# Patient Record
Sex: Male | Born: 1970 | Race: White | Hispanic: Yes | Marital: Married | Smoking: Current every day smoker
Health system: Southern US, Community
[De-identification: ages and names within clinical notes are randomized; demographics above are authoritative.]

## PROBLEM LIST (undated history)

## (undated) DIAGNOSIS — I10 Essential (primary) hypertension: Secondary | ICD-10-CM

---

## 2017-03-28 ENCOUNTER — Encounter (HOSPITAL_COMMUNITY): Payer: Self-pay | Admitting: Emergency Medicine

## 2017-03-28 DIAGNOSIS — I10 Essential (primary) hypertension: Secondary | ICD-10-CM | POA: Insufficient documentation

## 2017-03-28 DIAGNOSIS — K611 Rectal abscess: Secondary | ICD-10-CM | POA: Insufficient documentation

## 2017-03-28 DIAGNOSIS — F17210 Nicotine dependence, cigarettes, uncomplicated: Secondary | ICD-10-CM | POA: Insufficient documentation

## 2017-03-28 NOTE — ED Triage Notes (Signed)
Pt c/o 10/10 rectal pain for a week, unable to have a BM and very painful urination. Pt states he is unable to sit down because the pain denies any fever, chills, nausea or vomiting.

## 2017-03-29 ENCOUNTER — Emergency Department (HOSPITAL_COMMUNITY): Payer: Self-pay

## 2017-03-29 ENCOUNTER — Emergency Department (HOSPITAL_COMMUNITY)
Admission: EM | Admit: 2017-03-29 | Discharge: 2017-03-29 | Disposition: A | Discharge status: Home / Self Care | Payer: Self-pay | Attending: Emergency Medicine | Admitting: Emergency Medicine

## 2017-03-29 DIAGNOSIS — K611 Rectal abscess: Secondary | ICD-10-CM

## 2017-03-29 DIAGNOSIS — K6289 Other specified diseases of anus and rectum: Secondary | ICD-10-CM

## 2017-03-29 HISTORY — DX: Essential (primary) hypertension: I10

## 2017-03-29 LAB — CBC WITH DIFFERENTIAL/PLATELET
Basophils Absolute: 0 10*3/uL (ref 0.0–0.1)
Basophils Relative: 0 %
Eosinophils Absolute: 0.2 10*3/uL (ref 0.0–0.7)
Eosinophils Relative: 1 %
HCT: 40.1 % (ref 39.0–52.0)
Hemoglobin: 13.8 g/dL (ref 13.0–17.0)
Lymphocytes Relative: 12 %
Lymphs Abs: 2 10*3/uL (ref 0.7–4.0)
MCH: 32.5 pg (ref 26.0–34.0)
MCHC: 34.4 g/dL (ref 30.0–36.0)
MCV: 94.6 fL (ref 78.0–100.0)
Monocytes Absolute: 1.5 10*3/uL — ABNORMAL HIGH (ref 0.1–1.0)
Monocytes Relative: 9 %
Neutro Abs: 12.8 10*3/uL — ABNORMAL HIGH (ref 1.7–7.7)
Neutrophils Relative %: 78 %
Platelets: 238 10*3/uL (ref 150–400)
RBC: 4.24 MIL/uL (ref 4.22–5.81)
RDW: 12.4 % (ref 11.5–15.5)
WBC: 16.4 10*3/uL — ABNORMAL HIGH (ref 4.0–10.5)

## 2017-03-29 LAB — BASIC METABOLIC PANEL
Anion gap: 8 (ref 5–15)
BUN: 13 mg/dL (ref 6–20)
CO2: 25 mmol/L (ref 22–32)
Calcium: 8.5 mg/dL — ABNORMAL LOW (ref 8.9–10.3)
Chloride: 103 mmol/L (ref 101–111)
Creatinine, Ser: 0.83 mg/dL (ref 0.61–1.24)
GFR calc Af Amer: >60 mL/min (ref >60)
GFR calc non Af Amer: >60 mL/min (ref >60)
Glucose, Bld: 113 mg/dL — ABNORMAL HIGH (ref 65–99)
Potassium: 3.6 mmol/L (ref 3.5–5.1)
Sodium: 136 mmol/L (ref 135–145)

## 2017-03-29 MED ORDER — IOPAMIDOL (ISOVUE-300) INJECTION 61%
INTRAVENOUS | Status: AC
  Administered 2017-03-29: 100 mL
  Filled 2017-03-29: qty 100

## 2017-03-29 MED ORDER — MORPHINE SULFATE (PF) 4 MG/ML IV SOLN
4.0000 mg | Freq: Once | INTRAVENOUS | Status: AC
  Administered 2017-03-29: 4 mg via INTRAVENOUS
  Filled 2017-03-29: qty 1

## 2017-03-29 MED ORDER — OXYCODONE-ACETAMINOPHEN 5-325 MG PO TABS: 1.0000 | ORAL_TABLET | ORAL | 0 refills | Status: DC | PRN

## 2017-03-29 MED ORDER — SULFAMETHOXAZOLE-TRIMETHOPRIM 800-160 MG PO TABS
1.0000 | ORAL_TABLET | Freq: Once | ORAL | Status: AC
  Administered 2017-03-29: 1 via ORAL
  Filled 2017-03-29: qty 1

## 2017-03-29 MED ORDER — SULFAMETHOXAZOLE-TRIMETHOPRIM 800-160 MG PO TABS: 1.0000 | ORAL_TABLET | Freq: Two times a day (BID) | ORAL | 0 refills | Status: AC

## 2017-03-29 NOTE — Discharge Instructions (Signed)
Please read attached information. If you experience any new or worsening signs or symptoms please return to the emergency room for evaluation. Please follow-up with your primary care provider or specialist as discussed. Please use antibiotics and pain medication as directed.

## 2017-03-29 NOTE — ED Provider Notes (Signed)
[facility]-EMERGENCY DEPT Provider Note   CSN: [id number] Arrival date & time: 03/28/17  2332     History   Chief Complaint Chief Complaint  Patient presents with  . Rectal Pain  . Dysuria    HPI Jerry Avery is a 46 y.o. male.  HPI  Interpreter used  46 year old male presents today with complaints of rectal pain. Patient notes symptoms started approximately one week ago worsened over the last several days. He notes swelling around his anus. He reports he is not had any symptoms to this in the past. Denies any fever, chills, abdominal pain. He denies any dysuria, or any change to the color clarity or characteristics of his urine. He reports normal bowel movements without significant difficulty or pain, no blood or pus noted in the stool. Patient has history of hypertension, no other chronic health conditions. Eating and drinking without difficulty.    Past Medical History:  Diagnosis Date  . Hypertension     There are no active problems to display for this patient.   History reviewed. No pertinent surgical history.     Home Medications    Prior to Admission medications   Medication Sig Start Date End Date Taking? Authorizing Provider  oxyCODONE-acetaminophen (ROXICET) 5-325 MG tablet Take 1 tablet by mouth every 4 (four) hours as needed for severe pain. 03/29/17   Keelan Pomerleau, Tinnie Gens, PA-C  sulfamethoxazole-trimethoprim (BACTRIM DS,SEPTRA DS) 800-160 MG tablet Take 1 tablet by mouth 2 (two) times daily. 03/29/17 04/05/17  Eyvonne Mechanic, PA-C    Family History History reviewed. No pertinent family history.  Social History Social History  Substance Use Topics  . Smoking status: Current Every Day Smoker    Packs/day: 1.00    Types: Cigarettes  . Smokeless tobacco: Never Used  . Alcohol use Yes     Allergies   Patient has no known allergies.   Review of Systems Review of Systems  All other systems reviewed and are negative.  Physical Exam Updated Vital  Signs BP (!) 145/81   Pulse 77   Temp 98.4 F (36.9 C) (Oral)   Resp 16   Ht 5\' 4"  (1.626 m)   Wt 135 lb (61.2 kg)   SpO2 95%   BMI 23.17 kg/m   Physical Exam  Constitutional: He is oriented to person, place, and time. He appears well-developed and well-nourished.  HENT:  Head: Normocephalic and atraumatic.  Eyes: Conjunctivae are normal. Pupils are equal, round, and reactive to light. Right eye exhibits no discharge. Left eye exhibits no discharge. No scleral icterus.  Neck: Normal range of motion. No JVD present. No tracheal deviation present.  Pulmonary/Chest: Effort normal. No stridor.  Genitourinary:  Genitourinary Comments: Minor swelling and induration to the left sided perirectal region. Unable to complete a rectal exam due to patient him for level. No significant surrounding cellulitis, no fluctuance, no blood or pus on a rectal  Neurological: He is alert and oriented to person, place, and time. Coordination normal.  Psychiatric: He has a normal mood and affect. His behavior is normal. Judgment and thought content normal.  Nursing note and vitals reviewed.    ED Treatments / Results  Labs (all labs ordered are listed, but only abnormal results are displayed) Labs Reviewed  CBC WITH DIFFERENTIAL/PLATELET - Abnormal; Notable for the following:       Result Value   WBC 16.4 (*)    Neutro Abs 12.8 (*)    Monocytes Absolute 1.5 (*)    All other components within normal  limits  BASIC METABOLIC PANEL - Abnormal; Notable for the following:    Glucose, Bld 113 (*)    Calcium 8.5 (*)    All other components within normal limits    EKG  EKG Interpretation None       Radiology Ct Abdomen Pelvis W Contrast  Result Date: 03/29/2017 CLINICAL DATA:  Rectal pain x5 days EXAM: CT ABDOMEN AND PELVIS WITH CONTRAST TECHNIQUE: Multidetector CT imaging of the abdomen and pelvis was performed using the standard protocol following bolus administration of intravenous contrast.  CONTRAST:  100mL ISOVUE-300 IOPAMIDOL (ISOVUE-300) INJECTION 61% COMPARISON:  None. FINDINGS: Lower chest: Normal size cardiac chambers. Dependent atelectasis at each lung base. Hepatobiliary: No focal liver abnormality is seen. No gallstones, gallbladder wall thickening, or biliary dilatation. Pain is hypodensity along the falciform ligament may represent some fat deposition or partial volume averaging of the falciform. Pancreas: Normal Spleen: Normal Adrenals/Urinary Tract: Normal Stomach/Bowel: The stomach is physiologically distended. There is normal small bowel rotation without bowel obstruction or inflammation. There is stool throughout large intestine with descending and sigmoid diverticulosis and circular muscle hypertrophy. No acute inflammation is noted. No perirectal inflammation or abscess. Vascular/Lymphatic: Aortic atherosclerosis. No enlarged abdominal or pelvic lymph nodes. Reproductive: Prostate is unremarkable. Other: No abdominal wall hernia or abnormality. No abdominopelvic ascites. Musculoskeletal: Mild disc space narrowing L5-S1. No acute nor suspicious osseous abnormality. IMPRESSION: Descending and sigmoid colonic diverticulosis without acute diverticulitis. Electronically Signed   By: Tollie Ethavid  Kwon M.D.   On: 03/29/2017 02:28    Procedures Procedures (including critical care time)  Medications Ordered in ED Medications  morphine 4 MG/ML injection 4 mg (4 mg Intravenous Given 03/29/17 0129)  iopamidol (ISOVUE-300) 61 % injection (100 mLs  Contrast Given 03/29/17 0200)  sulfamethoxazole-trimethoprim (BACTRIM DS,SEPTRA DS) 800-160 MG per tablet 1 tablet (1 tablet Oral Given 03/29/17 0401)     Initial Impression / Assessment and Plan / ED Course  I have reviewed the triage vital signs and the nursing notes.  Pertinent labs & imaging results that were available during my care of the patient were reviewed by me and considered in my medical decision making (see chart for details).         Final Clinical Impressions(s) / ED Diagnoses   Final diagnoses:  Rectal pain  Perirectal abscess    46 year old male presents today with rectal pain. His presentation is most consistent with developing infection in the perirectal region. He has no significant cellulitis, but he does have induration. CT scan showed no concerning findings, bedside ultrasound showed no appreciable abscess that would be amenable to I&D. Patient is having normal bowel movements without significant difficulty. He is afebrile and nontoxic. At this point antibiotic trial will be initiated, patient will be given pain medication, he will follow up with primary care for reassessment, and return immediately if any new or worsening signs or symptoms present. He verbalized understanding and agreement to today's plan had no further questions concerns.  New Prescriptions Discharge Medication List as of 03/29/2017  3:47 AM    START taking these medications   Details  oxyCODONE-acetaminophen (ROXICET) 5-325 MG tablet Take 1 tablet by mouth every 4 (four) hours as needed for severe pain., Starting Sun 03/29/2017, Print    sulfamethoxazole-trimethoprim (BACTRIM DS,SEPTRA DS) 800-160 MG tablet Take 1 tablet by mouth 2 (two) times daily., Starting Sun 03/29/2017, Until Sun 04/05/2017, Print         Delfin Squillace, OrlovistaJeffrey, PA-C 03/29/17 [id number]    Pricilla LovelessGoldston, Scott, MD 03/30/17  1452  

## 2018-07-14 ENCOUNTER — Encounter (HOSPITAL_COMMUNITY): Payer: Self-pay | Admitting: Emergency Medicine

## 2018-07-14 ENCOUNTER — Encounter (HOSPITAL_COMMUNITY): Payer: Self-pay | Admitting: *Deleted

## 2018-07-14 ENCOUNTER — Emergency Department (HOSPITAL_COMMUNITY)
Admission: EM | Admit: 2018-07-14 | Discharge: 2018-07-14 | Disposition: A | Discharge status: Other Acute Inpatient Hospital | Payer: Self-pay | Attending: Emergency Medicine | Admitting: Emergency Medicine

## 2018-07-14 ENCOUNTER — Other Ambulatory Visit: Payer: Self-pay

## 2018-07-14 ENCOUNTER — Inpatient Hospital Stay (HOSPITAL_COMMUNITY)
Admission: AD | Admit: 2018-07-14 | Discharge: 2018-07-17 | DRG: 885 | Disposition: A | Discharge status: Home / Self Care | Payer: Federal, State, Local not specified - Other | Source: Intra-hospital | Attending: Psychiatry | Admitting: Psychiatry

## 2018-07-14 DIAGNOSIS — F1024 Alcohol dependence with alcohol-induced mood disorder: Secondary | ICD-10-CM

## 2018-07-14 DIAGNOSIS — F1020 Alcohol dependence, uncomplicated: Secondary | ICD-10-CM | POA: Diagnosis present

## 2018-07-14 DIAGNOSIS — R45851 Suicidal ideations: Secondary | ICD-10-CM | POA: Diagnosis present

## 2018-07-14 DIAGNOSIS — I10 Essential (primary) hypertension: Secondary | ICD-10-CM | POA: Insufficient documentation

## 2018-07-14 DIAGNOSIS — G4700 Insomnia, unspecified: Secondary | ICD-10-CM | POA: Diagnosis present

## 2018-07-14 DIAGNOSIS — F17210 Nicotine dependence, cigarettes, uncomplicated: Secondary | ICD-10-CM | POA: Diagnosis present

## 2018-07-14 DIAGNOSIS — E876 Hypokalemia: Secondary | ICD-10-CM | POA: Diagnosis present

## 2018-07-14 DIAGNOSIS — F1410 Cocaine abuse, uncomplicated: Secondary | ICD-10-CM

## 2018-07-14 DIAGNOSIS — F322 Major depressive disorder, single episode, severe without psychotic features: Secondary | ICD-10-CM | POA: Diagnosis present

## 2018-07-14 DIAGNOSIS — F10920 Alcohol use, unspecified with intoxication, uncomplicated: Secondary | ICD-10-CM

## 2018-07-14 DIAGNOSIS — F419 Anxiety disorder, unspecified: Secondary | ICD-10-CM | POA: Diagnosis present

## 2018-07-14 LAB — COMPREHENSIVE METABOLIC PANEL
ALBUMIN: 4.6 g/dL (ref 3.5–5.0)
ALT: 39 U/L (ref 0–44)
AST: 47 U/L — AB (ref 15–41)
Alkaline Phosphatase: 102 U/L (ref 38–126)
Anion gap: 14 (ref 5–15)
BILIRUBIN TOTAL: 0.7 mg/dL (ref 0.3–1.2)
BUN: 13 mg/dL (ref 6–20)
CO2: 21 mmol/L — ABNORMAL LOW (ref 22–32)
Calcium: 9.2 mg/dL (ref 8.9–10.3)
Chloride: 107 mmol/L (ref 98–111)
Creatinine, Ser: 0.7 mg/dL (ref 0.61–1.24)
GFR calc Af Amer: >60 mL/min (ref >60)
GLUCOSE: 113 mg/dL — AB (ref 70–99)
Potassium: 3.3 mmol/L — ABNORMAL LOW (ref 3.5–5.1)
Sodium: 142 mmol/L (ref 135–145)
TOTAL PROTEIN: 7.5 g/dL (ref 6.5–8.1)

## 2018-07-14 LAB — CBC
HCT: 46.1 % (ref 39.0–52.0)
HEMOGLOBIN: 15.5 g/dL (ref 13.0–17.0)
MCH: 31.8 pg (ref 26.0–34.0)
MCHC: 33.6 g/dL (ref 30.0–36.0)
MCV: 94.7 fL (ref 78.0–100.0)
Platelets: 291 10*3/uL (ref 150–400)
RBC: 4.87 MIL/uL (ref 4.22–5.81)
RDW: 12.1 % (ref 11.5–15.5)
WBC: 10.5 10*3/uL (ref 4.0–10.5)

## 2018-07-14 LAB — ETHANOL: ALCOHOL ETHYL (B): 212 mg/dL — AB (ref <10)

## 2018-07-14 LAB — RAPID URINE DRUG SCREEN, HOSP PERFORMED
Amphetamines: NOT DETECTED
BARBITURATES: NOT DETECTED
Benzodiazepines: NOT DETECTED
COCAINE: POSITIVE — AB
OPIATES: NOT DETECTED
TETRAHYDROCANNABINOL: NOT DETECTED

## 2018-07-14 MED ORDER — LORAZEPAM 1 MG PO TABS: 0.0000 mg | ORAL_TABLET | Freq: Two times a day (BID) | ORAL | Status: DC

## 2018-07-14 MED ORDER — THIAMINE HCL 100 MG/ML IJ SOLN: 100.0000 mg | Freq: Every day | INTRAMUSCULAR | Status: DC

## 2018-07-14 MED ORDER — ZOLPIDEM TARTRATE 5 MG PO TABS: 5.0000 mg | ORAL_TABLET | Freq: Every evening | ORAL | Status: DC | PRN

## 2018-07-14 MED ORDER — LORAZEPAM 1 MG PO TABS: 0.0000 mg | ORAL_TABLET | Freq: Four times a day (QID) | ORAL | Status: DC

## 2018-07-14 MED ORDER — LOPERAMIDE HCL 2 MG PO CAPS: 2.0000 mg | ORAL_CAPSULE | ORAL | Status: DC | PRN

## 2018-07-14 MED ORDER — ONDANSETRON 4 MG PO TBDP: 4.0000 mg | ORAL_TABLET | Freq: Four times a day (QID) | ORAL | Status: DC | PRN

## 2018-07-14 MED ORDER — HYDROXYZINE HCL 25 MG PO TABS
25.0000 mg | ORAL_TABLET | Freq: Four times a day (QID) | ORAL | Status: DC | PRN
  Administered 2018-07-14 – 2018-07-15 (×2): 25 mg via ORAL
  Filled 2018-07-14: qty 10
  Filled 2018-07-14 (×2): qty 1

## 2018-07-14 MED ORDER — ACETAMINOPHEN 325 MG PO TABS
650.0000 mg | ORAL_TABLET | ORAL | Status: DC | PRN
  Administered 2018-07-14: 650 mg via ORAL
  Filled 2018-07-14: qty 2

## 2018-07-14 MED ORDER — VITAMIN B-1 100 MG PO TABS
100.0000 mg | ORAL_TABLET | Freq: Every day | ORAL | Status: DC
  Administered 2018-07-15 – 2018-07-17 (×3): 100 mg via ORAL
  Filled 2018-07-14 (×5): qty 1

## 2018-07-14 MED ORDER — LORAZEPAM 2 MG/ML IJ SOLN: 0.0000 mg | Freq: Four times a day (QID) | INTRAMUSCULAR | Status: DC

## 2018-07-14 MED ORDER — LORAZEPAM 2 MG/ML IJ SOLN: 0.0000 mg | Freq: Two times a day (BID) | INTRAMUSCULAR | Status: DC

## 2018-07-14 MED ORDER — CHLORDIAZEPOXIDE HCL 25 MG PO CAPS
25.0000 mg | ORAL_CAPSULE | Freq: Four times a day (QID) | ORAL | Status: DC | PRN
  Administered 2018-07-15: 25 mg via ORAL
  Filled 2018-07-14: qty 1

## 2018-07-14 MED ORDER — ALUM & MAG HYDROXIDE-SIMETH 200-200-20 MG/5ML PO SUSP: 30.0000 mL | Freq: Four times a day (QID) | ORAL | Status: DC | PRN

## 2018-07-14 MED ORDER — ADULT MULTIVITAMIN W/MINERALS CH
1.0000 | ORAL_TABLET | Freq: Every day | ORAL | Status: DC
  Administered 2018-07-15 – 2018-07-17 (×3): 1 via ORAL
  Filled 2018-07-14 (×5): qty 1

## 2018-07-14 MED ORDER — VITAMIN B-1 100 MG PO TABS
100.0000 mg | ORAL_TABLET | Freq: Every day | ORAL | Status: DC
  Administered 2018-07-14: 100 mg via ORAL
  Filled 2018-07-14: qty 1

## 2018-07-14 MED ORDER — TRAZODONE HCL 50 MG PO TABS
50.0000 mg | ORAL_TABLET | Freq: Every evening | ORAL | Status: DC | PRN
  Filled 2018-07-14 (×7): qty 1

## 2018-07-14 MED ORDER — ONDANSETRON HCL 4 MG PO TABS: 4.0000 mg | ORAL_TABLET | Freq: Three times a day (TID) | ORAL | Status: DC | PRN

## 2018-07-14 NOTE — ED Notes (Signed)
TTS in process utilizing interpreter.

## 2018-07-14 NOTE — ED Triage Notes (Signed)
Pt speaks spanish and translator was used for the duration of his triage. Pt arrived today for c/o HTN and suicidal ideation with plan to cut his wrists or throat. Pt has superficial wounds to upper forearms where he states he cut his arms with a knife from work this evening. States that his wife told him today that she did not want to live with him anymore. He reports drinking 4 beers today and using cocaine. Pt also reports that he hears voices telling him to hurt himself. Pt agreeable to treatment and plan of care for this visit

## 2018-07-14 NOTE — ED Notes (Signed)
Staffing called for sitter assignment Pt belongings inventoried and placed in locker # Valuables envelope: [id number] placed with security  Security notified to wand pt

## 2018-07-14 NOTE — Progress Notes (Signed)
Pt accepted to [facility], Bed 407-1  Shuvon Rankin, NP is the accepting provider.  Dr. Nehemiah Massed, MDs the attending provider.  Call report to [phone] [facility] notified.   Pt is Voluntary.  Pt may be transported by [facility]  Pt scheduled  to arrive at [facility]@12 :30PM  [facility] T. Carney Bern, MSW, LCSWA Disposition Clinical Social Work [phone] (cell) [phone] (office)

## 2018-07-14 NOTE — ED Provider Notes (Signed)
[facility] EMERGENCY DEPARTMENT Provider Note   CSN: [id number] Arrival date & time: 07/14/18  0152     History   Chief Complaint Chief Complaint  Patient presents with  . Suicidal    HPI Jerry Avery is a 47 y.o. male.  HPI 48 yo male presenting to ER with increasing suicdal thoughts. Reports auditory hallucinations. Recent marital stress and arguments. Admitted to drinking ETOH tonight. No other complaints. Symptoms are moderate in severity   Past Medical History:  Diagnosis Date  . Hypertension     There are no active problems to display for this patient.   History reviewed. No pertinent surgical history.      Home Medications    Prior to Admission medications   Medication Sig Start Date End Date Taking? Authorizing Provider  oxyCODONE-acetaminophen (ROXICET) 5-325 MG tablet Take 1 tablet by mouth every 4 (four) hours as needed for severe pain. 03/29/17   Eyvonne Mechanic, PA-C    Family History No family history on file.  Social History Social History   Tobacco Use  . Smoking status: Current Every Day Smoker    Packs/day: 0.50    Types: Cigarettes  . Smokeless tobacco: Never Used  Substance Use Topics  . Alcohol use: Yes    Alcohol/week: 10.0 standard drinks    Types: 10 Cans of beer per week  . Drug use: Yes    Types: Cocaine     Allergies   Patient has no known allergies.   Review of Systems Review of Systems  All other systems reviewed and are negative.    Physical Exam Updated Vital Signs BP (!) 131/92 (BP Location: Right Arm)   Pulse 86   Temp 97.7 F (36.5 C) (Oral)   Resp 18   SpO2 99%   Physical Exam  Constitutional: He is oriented to person, place, and time. He appears well-developed and well-nourished.  HENT:  Head: Normocephalic.  Eyes: EOM are normal.  Neck: Normal range of motion.  Cardiovascular: Normal rate.  Pulmonary/Chest: Effort normal and breath sounds normal.  Abdominal: He exhibits  no distension.  Musculoskeletal: Normal range of motion.  Neurological: He is alert and oriented to person, place, and time.  Psychiatric: He has a normal mood and affect.  Nursing note and vitals reviewed.    ED Treatments / Results  Labs (all labs ordered are listed, but only abnormal results are displayed) Labs Reviewed  COMPREHENSIVE METABOLIC PANEL - Abnormal; Notable for the following components:      Result Value   Potassium 3.3 (*)    CO2 21 (*)    Glucose, Bld 113 (*)    AST 47 (*)    All other components within normal limits  ETHANOL - Abnormal; Notable for the following components:   Alcohol, Ethyl (B) 212 (*)    All other components within normal limits  RAPID URINE DRUG SCREEN, HOSP PERFORMED - Abnormal; Notable for the following components:   Cocaine POSITIVE (*)    All other components within normal limits  CBC    EKG None  Radiology No results found.  Procedures Procedures (including critical care time)  Medications Ordered in ED Medications  LORazepam (ATIVAN) injection 0-4 mg (0 mg Intravenous Not Given 07/14/18 0410)    Or  LORazepam (ATIVAN) tablet 0-4 mg ( Oral See Alternative 07/14/18 0410)  LORazepam (ATIVAN) injection 0-4 mg (has no administration in time range)    Or  LORazepam (ATIVAN) tablet 0-4 mg (has no administration in  time range)  thiamine (VITAMIN B-1) tablet 100 mg (has no administration in time range)    Or  thiamine (B-1) injection 100 mg (has no administration in time range)  acetaminophen (TYLENOL) tablet 650 mg (has no administration in time range)  zolpidem (AMBIEN) tablet 5 mg (has no administration in time range)  ondansetron (ZOFRAN) tablet 4 mg (has no administration in time range)  alum & mag hydroxide-simeth (MAALOX/MYLANTA) 200-200-20 MG/5ML suspension 30 mL (has no administration in time range)     Initial Impression / Assessment and Plan / ED Course  I have reviewed the triage vital signs and the nursing  notes.  Pertinent labs & imaging results that were available during my care of the patient were reviewed by me and considered in my medical decision making (see chart for details).    Medically clear. Likely substance induced mood disorder. Given suicdal thoughts I will have TTS evaluate the patient for mental health placement  Final Clinical Impressions(s) / ED Diagnoses   Final diagnoses:  None    ED Discharge Orders    None       Azalia Bilis, MD 07/14/18 [phone]

## 2018-07-14 NOTE — Progress Notes (Signed)
Admission DAR Note: Pt is a 47 y/o spanish speaking male admitted to [facility] from [facility] with c/o depression and SI with plan to cut his wrists or throat. Pt presents anxious, restless / fidgety on initial contact. Reports "my wife wants to leave me, she don't want me to see my children (11y/o & 52 y/o) because of my drinking". Per pt, he went to the [facility] after using cocaine approximately 3 days ago and was tachycardia "my heart was beating too fast and I thought I was going to die". Reports he drinks on and off for 15 years now (BAL 212) on admission, smokes a pkt of cigarettes a day. Emotional support and availability offered to pt as needed.  Skin assessment done and belongings searched per protocol. Superficial scratches noted on pt's arms. Items deemed contraband secured in locker. Encouraged pt to voice concerns, attend to ADLS and comply with treatment regimen including groups. Unit orientation done, routines discussed and care plan reviewed with pt; understanding verbalized. Q 15 minutes safety checks initiated without self harm gestures or outburst to note at this time. Care plan initiated for safety and mood stability.

## 2018-07-14 NOTE — BH Assessment (Signed)
Assessment Note  Jerry Avery is a 47 y.o. male who presented to [facility] on voluntary basis with complaint of suicidal ideation, self-described suicide attempt, and other depressive symptoms.  Pt stated that he lives in a hotel in [place], that he is employed, and that he does not have a Therapist, sports.  Pt speaks Spanish and required the use of an interpreter.  Pt reported as follows:  Pt lives with his wife and children.  Recently his wife told him that she wanted nothing to do with him and that she will divorce him soon.  Pt reported that since then, he has felt suicidal.  He denied a plan, but he also stated that on 07/13/18, he cut his arms in a self-described suicide attempt (no sutures or stitches required).   Pt also endorsed the following symptoms:  Despondency; feelings of worthlessness; anxiety; fatigue; poor concentration.  Pt reported on 07/13/18 that he was experiencing auditory hallucinations, which he denied to author.  Pt also endorsed episodic use of cocaine and alcohol.  Pt's UDS was positive for cocaine.  He was vague about amount used on 07/13/18.  When asked if he would be safe to go home, Pt stated, ''I don't know what would happen.  I can't promise.''  Pt denied any past psychiatric care/inpatient treatment.  During assessment, Pt presented as alert and oriented.  Pt was dressed in scrubs, and he appeared appropriately groomed.  Mood was depressed.  Affect was blunted.  Pt's demeanor was calm.  Pt endorsed suicidal ideation, recent self-described suicide attempt (superficial cuts to arm), despondency, drug use, and other symptoms.  Pt's speech was normal in rate, rhythm, and volume.  Pt's thought processes were within normal range, and thought content was logical and goal-oriented.  There was no evidence of delusion.  Pt denied homicidal ideation, current auditory hallucination, and self-injurious behavior (apart from the cuts he made on arm 07/13/18).  Pt's memory and concentration were  fair.  Pt's insight, judgment, and impulse control were poor.  Consulted with S. Rankin, NP who determined that Pt meets inpatient criteria.  Diagnosis: F32.2 Major Depressive Disorder, Single Ep., Severe w/o psychotic features  Past Medical History:  Past Medical History:  Diagnosis Date  . Hypertension     History reviewed. No pertinent surgical history.  Family History: No family history on file.  Social History:  reports that he has been smoking cigarettes. He has been smoking about 0.50 packs per day. He has never used smokeless tobacco. He reports that he drinks about 10.0 standard drinks of alcohol per week. He reports that he has current or past drug history. Drug: Cocaine.  Additional Social History:  Alcohol / Drug Use Pain Medications: See MAR Prescriptions: See MAR Over the Counter: See MAR History of alcohol / drug use?: Yes Substance #1 Name of Substance 1: Cocaine 1 - Amount (size/oz): Varied 1 - Frequency: Episodic 1 - Duration: Ongoing 1 - Last Use / Amount: 07/13/18  CIWA: CIWA-Ar BP: (!) 131/92 Pulse Rate: 86 Nausea and Vomiting: no nausea and no vomiting Tactile Disturbances: none Tremor: no tremor Auditory Disturbances: not present Paroxysmal Sweats: no sweat visible Visual Disturbances: not present Anxiety: no anxiety, at ease Headache, Fullness in Head: none present Agitation: normal activity Orientation and Clouding of Sensorium: oriented and can do serial additions CIWA-Ar Total: 0 COWS:    Allergies: No Known Allergies  Home Medications:  (Not in a hospital admission)  OB/GYN Status:  No LMP for male patient.  General Assessment  Data Location of Assessment: [facility] ED Is this a Tele or Face-to-Face Assessment?: Tele Assessment Is this an Initial Assessment or a Re-assessment for this encounter?: Initial Assessment Patient Accompanied by:: N/A Language Other than English: Yes(Spanish) What is your preferred language: Spanish Living  Arrangements: Other (Comment)(Hotel) What gender do you identify as?: Male Marital status: Married Pregnancy Status: No Living Arrangements: Spouse/significant other, Children Can pt return to current living arrangement?: Yes Admission Status: Voluntary Is patient capable of signing voluntary admission?: Yes Referral Source: Self/Family/Friend Insurance type: None     Crisis Care Plan Living Arrangements: Spouse/significant other, Children Name of Psychiatrist: None Name of Therapist: None  Education Status Is patient currently in school?: No Is the patient employed, unemployed or receiving disability?: Employed  Risk to self with the past 6 months Suicidal Ideation: Yes-Currently Present Has patient been a risk to self within the past 6 months prior to admission? : No Suicidal Intent: Yes-Currently Present Has patient had any suicidal intent within the past 6 months prior to admission? : No Is patient at risk for suicide?: Yes Suicidal Plan?: Yes-Currently Present Has patient had any suicidal plan within the past 6 months prior to admission? : No Specify Current Suicidal Plan: Pt cut his arms in self-described suicide attempt Access to Means: No What has been your use of drugs/alcohol within the last 12 months?: Cocaine,alcohol Previous Attempts/Gestures: No Other Self Harm Risks: Conflict with wife Intentional Self Injurious Behavior: Cutting Comment - Self Injurious Behavior: Cut his arms Family Suicide History: No Recent stressful life event(s): Conflict (Comment)(Conflict with wife) Persecutory voices/beliefs?: No Depression: Yes Depression Symptoms: Despondent, Tearfulness, Isolating, Fatigue, Guilt, Loss of interest in usual pleasures, Feeling worthless/self pity Substance abuse history and/or treatment for substance abuse?: Yes Suicide prevention information given to non-admitted patients: Not applicable  Risk to Others within the past 6 months Homicidal  Ideation: No Does patient have any lifetime risk of violence toward others beyond the six months prior to admission? : No Thoughts of Harm to Others: No Current Homicidal Intent: No Current Homicidal Plan: No Access to Homicidal Means: No History of harm to others?: No Assessment of Violence: None Noted Does patient have access to weapons?: No Criminal Charges Pending?: No Does patient have a court date: No Is patient on probation?: No  Psychosis Hallucinations: Auditory(Reported on 07/13/18) Delusions: None noted  Mental Status Report Appearance/Hygiene: Unremarkable, In scrubs Eye Contact: Fair Motor Activity: Freedom of movement, Unremarkable Speech: Logical/coherent Level of Consciousness: Alert Mood: Depressed Affect: Blunted Anxiety Level: None Thought Processes: Coherent, Relevant Judgement: Impaired Orientation: Person, Place, Time, Situation Obsessive Compulsive Thoughts/Behaviors: None  Cognitive Functioning Concentration: Normal Memory: Recent Intact, Remote Intact Is patient IDD: No Insight: Fair Impulse Control: Poor Appetite: Good Have you had any weight changes? : No Change Sleep: No Change Vegetative Symptoms: None  ADLScreening [facility] Assessment Services) Patient's cognitive ability adequate to safely complete daily activities?: Yes Patient able to express need for assistance with ADLs?: No Independently performs ADLs?: Yes (appropriate for developmental age)  Prior Inpatient Therapy Prior Inpatient Therapy: No  Prior Outpatient Therapy Prior Outpatient Therapy: No Does patient have an ACCT team?: No Does patient have Intensive In-House Services?  : No Does patient have [facility] services? : No Does patient have P4CC services?: No  ADL Screening (condition at time of admission) Patient's cognitive ability adequate to safely complete daily activities?: Yes Is the patient deaf or have difficulty hearing?: No Does the patient have difficulty seeing,  even when wearing glasses/contacts?:  No Does the patient have difficulty concentrating, remembering, or making decisions?: No Patient able to express need for assistance with ADLs?: No Does the patient have difficulty dressing or bathing?: No Independently performs ADLs?: Yes (appropriate for developmental age) Does the patient have difficulty walking or climbing stairs?: No Weakness of Legs: None Weakness of Arms/Hands: None  Home Assistive Devices/Equipment Home Assistive Devices/Equipment: None  Therapy Consults (therapy consults require a physician order) PT Evaluation Needed: No OT Evalulation Needed: No SLP Evaluation Needed: No Abuse/Neglect Assessment (Assessment to be complete while patient is alone) Abuse/Neglect Assessment Can Be Completed: Yes Physical Abuse: Denies Verbal Abuse: Denies Sexual Abuse: Denies Exploitation of patient/patient's resources: Denies Self-Neglect: Denies Values / Beliefs Cultural Requests During Hospitalization: Other (comment)(Needs Spanish speaking interpreter) Spiritual Requests During Hospitalization: None Consults Spiritual Care Consult Needed: No Social Work Consult Needed: No Merchant navy officer (For Healthcare) Does Patient Have a Medical Advance Directive?: No Would patient like information on creating a medical advance directive?: No - Patient declined          Disposition:  Disposition Initial Assessment Completed for this Encounter: Yes Disposition of Patient: Admit Type of inpatient treatment program: Adult(Per S. Rankin, NP, Pt meets inpt criteria)  On Site Evaluation by:   Reviewed with Physician:    Dorris Fetch Israa Caban 07/14/2018 8:45 AM

## 2018-07-14 NOTE — Tx Team (Signed)
Initial Treatment Plan 07/14/2018 7:21 PM Jerry Avery [id number]:[id number]    PATIENT STRESSORS: Marital or family conflict Substance abuse   PATIENT STRENGTHS: Ability for insight Average or above average intelligence Capable of independent living Licensed conveyancer Physical Health Work skills   PATIENT IDENTIFIED PROBLEMS: Alteration in mood (Anxiety and depression) "I am sad about my wife leaving me and not wanting me to see my children".  Risk for self harm  Substance abuse "I use cocaine and drink alcohol".                 DISCHARGE CRITERIA:  Improved stabilization in mood, thinking, and/or behavior Verbal commitment to aftercare and medication compliance  PRELIMINARY DISCHARGE PLAN: Outpatient therapy Return to previous living arrangement  PATIENT/FAMILY INVOLVEMENT: This treatment plan has been presented to and reviewed with the patient, Jerry Avery. The patient have been given the opportunity to ask questions and make suggestions.  Sherryl Manges, RN 07/14/2018, 7:21 PM

## 2018-07-15 DIAGNOSIS — R45851 Suicidal ideations: Secondary | ICD-10-CM

## 2018-07-15 DIAGNOSIS — F1024 Alcohol dependence with alcohol-induced mood disorder: Secondary | ICD-10-CM

## 2018-07-15 MED ORDER — POTASSIUM CHLORIDE CRYS ER 10 MEQ PO TBCR
10.0000 meq | EXTENDED_RELEASE_TABLET | Freq: Two times a day (BID) | ORAL | Status: AC
  Administered 2018-07-15 – 2018-07-16 (×3): 10 meq via ORAL
  Filled 2018-07-15 (×3): qty 1

## 2018-07-15 MED ORDER — TRAZODONE HCL 50 MG PO TABS
50.0000 mg | ORAL_TABLET | Freq: Every evening | ORAL | Status: DC | PRN
  Administered 2018-07-15 – 2018-07-16 (×2): 50 mg via ORAL
  Filled 2018-07-15: qty 7
  Filled 2018-07-15 (×3): qty 1

## 2018-07-15 NOTE — Progress Notes (Signed)
Nursing note 7p-7a  Pt observed interacting with peers on unit this shift. Displayed a bright affect and mood upon interaction with this Clinical research associate. Pt complains of headache pain 5/10 ,insomnia, and anxiety. See MAR for PRN medication administration. Pt denies SI/HI, and also denies any audio or visual hallucinations at this time. Assessment completed with cone interpreter. Pt is able to verbally contract for safety with this RN. Goal: "feel better more and more each day" Pt is now resting in bed with eyes closed, with no signs or symptoms of pain or distress noted. Pt continues to remain safe on the unit and is observed by rounding every 15 min. RN will continue to monitor.

## 2018-07-15 NOTE — H&P (Addendum)
Psychiatric Admission Assessment Adult ( interviewed in [place])  Patient Identification: Jerry Avery MRN:  [id number] Date of Evaluation:  07/15/2018 Chief Complaint:   " I am OK now " Principal Diagnosis:  Suicidal Ideations. Consider Adjustment Disorder with Depressed Mood. Alcohol Dependence . Diagnosis:   Patient Active Problem List   Diagnosis Date Noted  . MDD (major depressive disorder), single episode, severe , no psychosis ([place]) [F32.2] 07/14/2018   History of Present Illness: 47 year old male, lives with SO/common law wife . Employed. Spanish speaker only- interviewed in [place]. Presented to ED voluntarily due to depression, suicidal ideations with thoughts of cutting his throat. States these suicidal ideations were time limited and in the context of an argument with his SO, who told him she did not want him to return home.  Acknowledges the argument was about her being upset about his drinking .  Patient states that before this incident he was " feeling all right" and not depressed, without any suicidal ideations or significant neuro-vegetative symptoms of depression.  Of note, patient reports history of alcohol dependence- reports that he has history of heavy , daily drinking, up to 24 beers per day, but states he has cut down significantly and now drinks about 6 beers a day on most days of week. Admission BAL 212, and UDS positive for Cocaine ( denies history of cocaine abuse, states use was isolated ) Associated Signs/Symptoms: Depression Symptoms:  suicidal thoughts with specific plan, does not endorse anhedonia, or changes in sleep, appetite or energy level  (Hypo) Manic Symptoms:  denies , does not present with Anxiety Symptoms:  denies  Psychotic Symptoms:  Denies  PTSD Symptoms: Denies  Total Time spent with patient: 45 minutes  Past Psychiatric History: denies prior psychiatric admissions, reports history of depression, which he states is usually short term and  related to  relationship issues ,  denies history of suicide attempts, denies history of psychosis, denies history of mania, denies history of violence.   Is the patient at risk to self? Yes.    Has the patient been a risk to self in the past 6 months? No.  Has the patient been a risk to self within the distant past? No.  Is the patient a risk to others? No.  Has the patient been a risk to others in the past 6 months? No.  Has the patient been a risk to others within the distant past? No.   Prior Inpatient Therapy:  denies  Prior Outpatient Therapy:  denies   Alcohol Screening: Patient refused Alcohol Screening Tool: Yes 1. How often do you have a drink containing alcohol?: 2 to 4 times a month 2. How many drinks containing alcohol do you have on a typical day when you are drinking?: 3 or 4 3. How often do you have six or more drinks on one occasion?: Less than monthly AUDIT-C Score: 4 4. How often during the last year have you found that you were not able to stop drinking once you had started?: Never 5. How often during the last year have you failed to do what was normally expected from you becasue of drinking?: Never 6. How often during the last year have you needed a first drink in the morning to get yourself going after a heavy drinking session?: Never 7. How often during the last year have you had a feeling of guilt of remorse after drinking?: Never 8. How often during the last year have you been unable to remember  what happened the night before because you had been drinking?: Never 9. Have you or someone else been injured as a result of your drinking?: Yes, during the last year("my wife thinks I drink too much, will not let me see the kids") 10. Has a relative or friend or a doctor or another health worker been concerned about your drinking or suggested you cut down?: Yes, during the last year Alcohol Use Disorder Identification Test Final Score (AUDIT): 12 Intervention/Follow-up:  Alcohol Education, Continued Monitoring, Brief Advice Substance Abuse History in the last 12 months:  Reports history of alcohol dependence, used to drink up to 24 beers per day , but states he has cut down and now drinks 6 beers per day on average- drinks 4-5 times per week. Denies drug abuse .  Consequences of Substance Abuse: Denies history of seizures or history of severe alcohol WDL.  Previous Psychotropic Medications: states he has never been on psychiatric medications . States he has not been taking any medications prior to admission. Psychological Evaluations: No  Past Medical History:  Past Medical History:  Diagnosis Date  . Hypertension    History reviewed. No pertinent surgical history. Family History: mother passed away from cancer 5 years ago, father alive , has one sister and three brothers  Family Psychiatric  History: no mental illness in family, no history of suicides , no history of alcohol use disorder in family  Tobacco Screening: smokes 1/2  PPD Social History: 47 year old, single, lives with SO, has two children ( 16,12), employed as Horticulturist, commercial. He is originally from [place]. Social History   Substance and Sexual Activity  Alcohol Use Yes  . Alcohol/week: 10.0 standard drinks  . Types: 10 Cans of beer per week     Social History   Substance and Sexual Activity  Drug Use Yes  . Types: Cocaine   Comment: Episodic use    Additional Social History: Marital status: Long term relationship Number of Years Married: 56 Long term relationship, how long?: "we are not actually married but have been together 18 years" What types of issues is patient dealing with in the relationship?: Pt reports wife is upset with him because he has been drinking too much.  She asked him to leave 2 days ago.   Are you sexually active?: No What is your sexual orientation?: heterosexual Has your sexual activity been affected by drugs, alcohol, medication, or emotional stress?: na Does  patient have children?: Yes How many children?: 2 How is patient's relationship with their children?: son 29, daughter 30.  Good relationship with both, although they are upset about marital issues  Allergies:  No Known Allergies Lab Results:  Results for orders placed or performed during the hospital encounter of 07/14/18 (from the past 48 hour(s))  Rapid urine drug screen (hospital performed)     Status: Abnormal   Collection Time: 07/14/18  2:50 AM  Result Value Ref Range   Opiates NONE DETECTED NONE DETECTED   Cocaine POSITIVE (A) NONE DETECTED   Benzodiazepines NONE DETECTED NONE DETECTED   Amphetamines NONE DETECTED NONE DETECTED   Tetrahydrocannabinol NONE DETECTED NONE DETECTED   Barbiturates NONE DETECTED NONE DETECTED    Comment: (NOTE) DRUG SCREEN FOR MEDICAL PURPOSES ONLY.  IF CONFIRMATION IS NEEDED FOR ANY PURPOSE, NOTIFY LAB WITHIN 5 DAYS. LOWEST DETECTABLE LIMITS FOR URINE DRUG SCREEN Drug Class                     Cutoff (ng/mL)  Amphetamine and metabolites    1000 Barbiturate and metabolites    200 Benzodiazepine                 947 Tricyclics and metabolites     300 Opiates and metabolites        300 Cocaine and metabolites        300 THC                            50 Performed at [facility], [place] [address] [place], [place] [address]   Comprehensive metabolic panel     Status: Abnormal   Collection Time: 07/14/18  2:52 AM  Result Value Ref Range   Sodium 142 135 - 145 mmol/L   Potassium 3.3 (L) 3.5 - 5.1 mmol/L   Chloride 107 98 - 111 mmol/L   CO2 21 (L) 22 - 32 mmol/L   Glucose, Bld 113 (H) 70 - 99 mg/dL   BUN 13 6 - 20 mg/dL   Creatinine, Ser 0.70 0.61 - 1.24 mg/dL   Calcium 9.2 8.9 - 10.3 mg/dL   Total Protein 7.5 6.5 - 8.1 g/dL   Albumin 4.6 3.5 - 5.0 g/dL   AST 47 (H) 15 - 41 U/L   ALT 39 0 - 44 U/L   Alkaline Phosphatase 102 38 - 126 U/L   Total Bilirubin 0.7 0.3 - 1.2 mg/dL   GFR calc non Af Amer >60 >60 mL/min   GFR calc Af Amer  >60 >60 mL/min    Comment: (NOTE) The eGFR has been calculated using the CKD EPI equation. This calculation has not been validated in all clinical situations. eGFR's persistently <60 mL/min signify possible Chronic Kidney Disease.    Anion gap 14 5 - 15    Comment: Performed at [facility] [address], [place], [place] [address]  Ethanol     Status: Abnormal   Collection Time: 07/14/18  2:52 AM  Result Value Ref Range   Alcohol, Ethyl (B) 212 (H) <10 mg/dL    Comment: (NOTE) Lowest detectable limit for serum alcohol is 10 mg/dL. For medical purposes only. Performed at [facility], [place] [address], [place], [place] [address]   cbc     Status: None   Collection Time: 07/14/18  2:52 AM  Result Value Ref Range   WBC 10.5 4.0 - 10.5 K/uL   RBC 4.87 4.22 - 5.81 MIL/uL   Hemoglobin 15.5 13.0 - 17.0 g/dL   HCT 46.1 39.0 - 52.0 %   MCV 94.7 78.0 - 100.0 fL   MCH 31.8 26.0 - 34.0 pg   MCHC 33.6 30.0 - 36.0 g/dL   RDW 12.1 11.5 - 15.5 %   Platelets 291 150 - 400 K/uL    Comment: Performed at [facility], [place] [address], [place], [place] [address]    Blood Alcohol level:  Lab Results  Component Value Date   ETH 212 (H) 01/74/9449    Metabolic Disorder Labs:  No results found for: HGBA1C, MPG No results found for: PROLACTIN No results found for: CHOL, TRIG, HDL, CHOLHDL, VLDL, LDLCALC  Current Medications: Current Facility-Administered Medications  Medication Dose Route Frequency Provider Last Rate Last Dose  . chlordiazePOXIDE (LIBRIUM) capsule 25 mg  25 mg Oral Q6H PRN Rankin, Shuvon B, NP      . hydrOXYzine (ATARAX/VISTARIL) tablet 25 mg  25 mg Oral Q6H PRN Rankin, Shuvon  B, NP   25 mg at 07/14/18 2112  . loperamide (IMODIUM) capsule 2-4 mg  2-4 mg Oral PRN Rankin, Shuvon B, NP      . multivitamin with minerals tablet 1 tablet  1 tablet Oral Daily Rankin, Shuvon B, NP   1 tablet at 07/15/18 0837  . ondansetron (ZOFRAN-ODT) disintegrating tablet 4 mg   4 mg Oral Q6H PRN Rankin, Shuvon B, NP      . thiamine (VITAMIN B-1) tablet 100 mg  100 mg Oral Daily Rankin, Shuvon B, NP   100 mg at 07/15/18 0837  . traZODone (DESYREL) tablet 50 mg  50 mg Oral QHS,MR X 1 Lindon Romp A, NP       PTA Medications: No medications prior to admission.    Musculoskeletal: Strength & Muscle Tone: within normal limits- mild tremors , no diaphoresis or psychomotor restlessness  Gait & Station: normal Patient leans: N/A  Psychiatric Specialty Exam: Physical Exam  Review of Systems  Constitutional: Negative.   HENT: Negative.   Eyes: Negative.   Respiratory: Negative.   Cardiovascular: Negative.   Gastrointestinal: Negative.   Genitourinary: Negative.   Musculoskeletal: Negative.   Skin: Negative.   Neurological: Negative.  Negative for seizures.  Endo/Heme/Allergies: Negative.   Psychiatric/Behavioral: Positive for depression, substance abuse and suicidal ideas.  All other systems reviewed and are negative.   Blood pressure (!) 134/96, pulse 70, temperature 97.9 F (36.6 C), temperature source Oral, resp. rate 16, height '5\' 4"'  (1.626 m), weight 55.8 kg, SpO2 100 %.Body mass index is 21.11 kg/m.  General Appearance: Fairly Groomed  Eye Contact:  Good  Speech:  Normal Rate  Volume:  Normal  Mood:  reports mood is "OK", currently denies depression  Affect:  appropriate, reactive, slightly anxious  Thought Process:  Linear and Descriptions of Associations: Intact  Orientation:  Full (Time, Place, and Person)  Thought Content:  denies hallucinations, no delusions   Suicidal Thoughts:  No denies suicidal ideations, contract for safety on unit, denies homicidal ideations, and also specifically denies any homicidal or violent ideations towards SO  Homicidal Thoughts:  No  Memory:  recent and remote grossly intact   Judgement:  Fair  Insight:  Fair  Psychomotor Activity:  Normal- mild distal tremors  Concentration:  Concentration: Good and Attention  Span: Good  Recall:  Good  Fund of Knowledge:  Good  Language:  Good  Akathisia:  Negative  Handed:  Right  AIMS (if indicated):     Assets:  Desire for Improvement Resilience  ADL's:  Intact  Cognition:  WNL  Sleep:  Number of Hours: 6.75    Treatment Plan Summary: Daily contact with patient to assess and evaluate symptoms and progress in treatment, Medication management, Plan inpatient treatment  and medications as below  Observation Level/Precautions:  15 minute checks  Laboratory:  as needed - recheck BMP in AM  Psychotherapy:  Milieu, group therapy   Medications: Librium detox protocol ( PRN) , We discussed starting an antidepressant medication but he declines, states he does not feel he needs it as he is feeling a lot better today  Will start [place] for mild hypokalemia  Consultations: as needed   Discharge Concerns: - states wife will not allow him to return home  Estimated LOS: 3-4 days   Other:     Physician Treatment Plan for Primary Diagnosis:  Suicidal Ideations  Long Term Goal(s): Improvement in symptoms so as ready for discharge  Short Term Goals: Ability to identify  changes in lifestyle to reduce recurrence of condition will improve, Ability to verbalize feelings will improve, Ability to disclose and discuss suicidal ideas, Ability to demonstrate self-control will improve, Ability to identify and develop effective coping behaviors will improve and Ability to maintain clinical measurements within normal limits will improve  Physician Treatment Plan for Secondary Diagnosis:  Alcohol Dependence Long Term Goal(s): Improvement in symptoms so as ready for discharge  Short Term Goals: Ability to identify triggers associated with substance abuse/mental health issues will improve  I certify that inpatient services furnished can reasonably be expected to improve the patient's condition.    Jenne Campus, MD 9/5/20191:55 PM

## 2018-07-15 NOTE — [facility] Suicide Risk Assessment (Signed)
[facility] Admission Suicide Risk Assessment   Nursing information obtained from:  Patient Demographic factors:  NA Current Mental Status:  Suicidal ideation indicated by patient(Prior to admission) Loss Factors:  Loss of significant relationship("wife wants a divorce") Historical Factors:  NA Risk Reduction Factors:  Living with another person, especially a relative, Positive social support, Employed, Sense of responsibility to family, Religious beliefs about death  Total Time spent with patient: 45 minutes  Principal Problem: Alcohol Dependence Diagnosis:   Patient Active Problem List   Diagnosis Date Noted  . MDD (major depressive disorder), single episode, severe , no psychosis ([facility]) [F32.2] 07/14/2018   Subjective Data:   Continued Clinical Symptoms:  Alcohol Use Disorder Identification Test Final Score (AUDIT): 12 The "Alcohol Use Disorders Identification Test", Guidelines for Use in Primary Care, Second Edition.  World Science writer [facility]). Score between 0-7:  no or low risk or alcohol related problems. Score between 8-15:  moderate risk of alcohol related problems. Score between 16-19:  high risk of alcohol related problems. Score 20 or above:  warrants further diagnostic evaluation for alcohol dependence and treatment.   CLINICAL FACTORS:  47 year old male, presented due to depression and suicidal ideations of cutting self which he states were acute/time limited and related to argument with his wife. Currently reports he is feeling better and at present minimizes depression and denies SI. History of alcohol dependence .    Psychiatric Specialty Exam: Physical Exam  ROS  Blood pressure (!) 134/96, pulse 70, temperature 97.9 F (36.6 C), temperature source Oral, resp. rate 16, height 5\' 4"  (1.626 m), weight 55.8 kg, SpO2 100 %.Body mass index is 21.11 kg/m.  See admit note MSE                                                        COGNITIVE  FEATURES THAT CONTRIBUTE TO RISK:  Closed-mindedness and Loss of executive function    SUICIDE RISK:   Moderate:  Frequent suicidal ideation with limited intensity, and duration, some specificity in terms of plans, no associated intent, good self-control, limited dysphoria/symptomatology, some risk factors present, and identifiable protective factors, including available and accessible social support.  PLAN OF CARE: Patient will be admitted to inpatient psychiatric unit for stabilization and safety. Will provide and encourage milieu participation. Provide medication management and maked adjustments as needed. Will also provide medication management to minimize risk of WDL.   Will follow daily.    I certify that inpatient services furnished can reasonably be expected to improve the patient's condition.   Craige Cotta, MD 07/15/2018, 2:20 PM

## 2018-07-15 NOTE — Progress Notes (Signed)
D:  Jerry Avery was very restless and pacing this evening. He denied SI/HI or A/V hallucinations.  He denied any pain or discomfort and appeared to be in no physical distress.  He did report that he takes blood pressure medications, names unknown to patient, so encouraged him to discuss this with MD tomorrow.  Blood pressure stable at this time.  He was repeatedly trying to call his wife this evening and she didn't want to speak with him.  He was worried about getting his hotel card key to her but was able to speak with his son, Jerry Avery.  Jerry Avery is going to be coming by the hospital to get the card key.  He was given vistaril prn for anxiety.  He now has standing trazodone orders. When RN went to inform him of new sleeping medication, he was already asleep.  Unable to get EKG due to patient immediately going to sleep after taking vistaril.   A:  1:1 with RN for support and encouragement.  Medications as ordered.  Q 15 minute checks maintained for safety.  Encouraged participation in group and unit activities.   R:  Jerry Avery remains safe on the unit.  We will continue to monitor the progress towards his goals.

## 2018-07-15 NOTE — Progress Notes (Signed)
Adult Psychoeducational Group Note  Date:  07/15/2018 Time:  11:05 PM  Group Topic/Focus:  Wrap-Up Group:   The focus of this group is to help patients review their daily goal of treatment and discuss progress on daily workbooks.  Participation Level:  Did Not Attend  Participation Quality:    Affect:   Cognitive:    Insight:  Engagement in Group:    Modes of Intervention:   Additional Comments:  Pt was invited to attend but declined.  Othello Sgroi 07/15/2018, 11:05 PM

## 2018-07-15 NOTE — [facility] Counselor (Signed)
Adult Comprehensive Assessment  Patient ID: Jerry Avery, male   DOB: 06-04-1971, 47 y.o.   MRN: [id number]  Information Source: Information source: Interpreter(spanish)  Current Stressors:  Patient states their primary concerns and needs for treatment are:: Hopes medicine will help me feel better so I can go home.  Patient states their goals for this hospitilization and ongoing recovery are:: Feel better Family Relationships: Pt reports he is having marital problems. "That's it, other than that I'm calm." Substance abuse: Pt reports he is drinking too much.  Has been drinking 12-24 beers per day.   Living/Environment/Situation:  Living Arrangements: Spouse/significant other, Children Living conditions (as described by patient or guardian): Pt wife asked him to leave.  Pt is going to be going staying in a hotel through work when he leaves. Who else lives in the home?: wife, 2 children How long has patient lived in current situation?: 16 years What is atmosphere in current home: Other (Comment)(conflict)  Family History:  Marital status: Long term relationship Number of Years Married: 18 Long term relationship, how long?: "we are not actually married but have been together 18 years" What types of issues is patient dealing with in the relationship?: Pt reports wife is upset with him because he has been drinking too much.  She asked him to leave 2 days ago.   Are you sexually active?: No What is your sexual orientation?: heterosexual Has your sexual activity been affected by drugs, alcohol, medication, or emotional stress?: na Does patient have children?: Yes How many children?: 2 How is patient's relationship with their children?: son 5, daughter 28.  Good relationship with both, although they are upset about marital issues  Childhood History:  By whom was/is the patient raised?: Both parents Additional childhood history information: Parents remained married.  Good childhood.  Lots  of work.  Lived in [place], came to [place] at age 71. Description of patient's relationship with caregiver when they were a child: mom: very good, dad: good Patient's description of current relationship with people who raised him/her: mom: deceased, dad: in [place], some phone contact How were you disciplined when you got in trouble as a child/adolescent?: appropriate physical discipline Does patient have siblings?: Yes Number of Siblings: 4 Description of patient's current relationship with siblings: 1 sister, 3 brothers.  Two brothers in [place], [place] and brother in [place].  Good relationships. Did patient suffer any verbal/emotional/physical/sexual abuse as a child?: No Did patient suffer from severe childhood neglect?: No Has patient ever been sexually abused/assaulted/raped as an adolescent or adult?: No Was the patient ever a victim of a crime or a disaster?: No Witnessed domestic violence?: No Has patient been effected by domestic violence as an adult?: No  Education:  Highest grade of school patient has completed: 6th grade Currently a student?: No Learning disability?: Yes What learning problems does patient have?: unsure exact name: problems learning to read and write.  Employment/Work Situation:   Employment situation: Employed Where is patient currently employed?: Scientist, water quality in [place] How long has patient been employed?: 4 years Patient's job has been impacted by current illness: No What is the longest time patient has a held a job?: current job Did You Receive Any Psychiatric Treatment/Services While in Equities trader?: No Are There Guns or Other Weapons in Your Home?: No  Financial Resources:   Financial resources: Income from employment, Income from spouse Does patient have a representative payee or guardian?: No  Alcohol/Substance Abuse:   What has been your use of drugs/alcohol  within the last 12 months?: (Pt reports he is going to stop drinking but does not want  treatment.  Will stop on his own.) If attempted suicide, did drugs/alcohol play a role in this?: Yes(I don't think so. (seemed unsure)) Alcohol/Substance Abuse Treatment Hx: Denies past history Has alcohol/substance abuse ever caused legal problems?: Yes(DWI. 2014)  Social Support System:   Patient's Community Support System: Fair Museum/gallery exhibitions officer System: employer, family Type of faith/religion: Catholic How does patient's faith help to cope with current illness?: Listening to [employer] word helps  Leisure/Recreation:   Leisure and Hobbies: yard work, English as a second language teacher:   What is the patient's perception of their strengths?: working, gardening Patient states they can use these personal strengths during their treatment to contribute to their recovery: unable to answer: lost in translation Patient states these barriers may affect/interfere with their treatment: none Patient states these barriers may affect their return to the community: none Other important information patient would like considered in planning for their treatment: none  Discharge Plan:   Currently receiving community mental health services: No(Goes to [facility] clinic) Patient states concerns and preferences for aftercare planning are: none identified Patient states they will know when they are safe and ready for discharge when: I'm already better Does patient have access to transportation?: Yes Does patient have financial barriers related to discharge medications?: Yes Plan for living situation after discharge: Will be staying in a hotel with other workers from his current job. Will patient be returning to same living situation after discharge?: No  Summary/Recommendations:   Summary and Recommendations (to be completed by the evaluator): Pt is 47 year old male from [place].  Pt is diagnosed with major depressive disorder and was admitted due to depression and suicidal ideation.  Pt reports  his wife recently asked him to leave due to his excessive drinking.  Recommendations for pt include crisis stabilization, therapeutic milieu, attend and participate in groups, medication management, and development of comprehensive mental wellness plan.  Lorri Frederick. 07/15/2018

## 2018-07-15 NOTE — Plan of Care (Signed)
  Problem: Activity: Goal: Sleeping patterns will improve Outcome: Progressing Note:  Jerry Avery has been in his bed all night.  He appears to be sleeping.

## 2018-07-15 NOTE — Plan of Care (Signed)
  Problem: Activity: Goal: Interest or engagement in activities will improve Outcome: Progressing   Problem: Safety: Goal: Periods of time without injury will increase Outcome: Progressing  DAR NOTE: Patient presents with anxious affect and depressed mood.  Denies pain, auditory and visual hallucinations.  Rates depression at 0, hopelessness at 0, and anxiety at 0.  Maintained on routine safety checks.  Medications given as prescribed.  Support and encouragement offered as needed.  Attended group and participated.  Patient visible in milieu with minimal interactions.  Offered no complaint.

## 2018-07-15 NOTE — Plan of Care (Signed)
  Problem: Education: Goal: Knowledge of [facility] Education information/materials will improve Outcome: Progressing Goal: Emotional status will improve Outcome: Progressing Goal: Mental status will improve Outcome: Progressing Goal: Verbalization of understanding the information provided will improve Outcome: Progressing Goal: Knowledge of [facility] Education information/materials will improve Outcome: Progressing Goal: Emotional status will improve Outcome: Progressing Goal: Mental status will improve Outcome: Progressing Goal: Verbalization of understanding the information provided will improve Outcome: Progressing   Problem: Activity: Goal: Interest or engagement in activities will improve Outcome: Progressing Goal: Sleeping patterns will improve Outcome: Progressing   Problem: Coping: Goal: Ability to verbalize frustrations and anger appropriately will improve Outcome: Progressing Goal: Ability to demonstrate self-control will improve Outcome: Progressing   Problem: Health Behavior/Discharge Planning: Goal: Identification of resources available to assist in meeting health care needs will improve Outcome: Progressing Goal: Compliance with treatment plan for underlying cause of condition will improve Outcome: Progressing   Problem: Physical Regulation: Goal: Ability to maintain clinical measurements within normal limits will improve Outcome: Progressing   Problem: Safety: Goal: Periods of time without injury will increase Outcome: Progressing   Problem: Coping: Goal: Ability to identify and develop effective coping behavior will improve Outcome: Progressing   Problem: Self-Concept: Goal: Ability to identify factors that promote anxiety will improve Outcome: Progressing Goal: Level of anxiety will decrease Outcome: Progressing Goal: Ability to modify response to factors that promote anxiety will improve Outcome: Progressing   Problem:  Health Behavior/Discharge Planning: Goal: Identification of resources available to assist in meeting health care needs will improve Outcome: Progressing Goal: Compliance with treatment plan for underlying cause of condition will improve Outcome: Progressing   Problem: Education: Goal: Knowledge of disease or condition will improve Outcome: Progressing Goal: Understanding of discharge needs will improve Outcome: Progressing   Problem: Health Behavior/Discharge Planning: Goal: Ability to identify changes in lifestyle to reduce recurrence of condition will improve Outcome: Progressing Goal: Identification of resources available to assist in meeting health care needs will improve Outcome: Progressing   Problem: Physical Regulation: Goal: Complications related to the disease process, condition or treatment will be avoided or minimized Outcome: Progressing

## 2018-07-16 DIAGNOSIS — F322 Major depressive disorder, single episode, severe without psychotic features: Principal | ICD-10-CM

## 2018-07-16 DIAGNOSIS — I10 Essential (primary) hypertension: Secondary | ICD-10-CM | POA: Diagnosis present

## 2018-07-16 LAB — BASIC METABOLIC PANEL
ANION GAP: 11 (ref 5–15)
BUN: 20 mg/dL (ref 6–20)
CHLORIDE: 103 mmol/L (ref 98–111)
CO2: 26 mmol/L (ref 22–32)
Calcium: 9.5 mg/dL (ref 8.9–10.3)
Creatinine, Ser: 0.81 mg/dL (ref 0.61–1.24)
GFR calc non Af Amer: >60 mL/min (ref >60)
Glucose, Bld: 93 mg/dL (ref 70–99)
POTASSIUM: 3.9 mmol/L (ref 3.5–5.1)
Sodium: 140 mmol/L (ref 135–145)

## 2018-07-16 LAB — TSH: TSH: 2.911 u[IU]/mL (ref 0.350–4.500)

## 2018-07-16 MED ORDER — POLYVINYL ALCOHOL 1.4 % OP SOLN
2.0000 [drp] | OPHTHALMIC | Status: DC | PRN
  Administered 2018-07-16 – 2018-07-17 (×2): 2 [drp] via OPHTHALMIC
  Filled 2018-07-16: qty 15

## 2018-07-16 MED ORDER — LISINOPRIL 10 MG PO TABS
10.0000 mg | ORAL_TABLET | Freq: Every day | ORAL | Status: DC
  Administered 2018-07-16 – 2018-07-17 (×2): 10 mg via ORAL
  Filled 2018-07-16 (×2): qty 1
  Filled 2018-07-16: qty 2
  Filled 2018-07-16: qty 7
  Filled 2018-07-16: qty 1
  Filled 2018-07-16: qty 7

## 2018-07-16 NOTE — Progress Notes (Addendum)
[facility] MD Progress Note Interviewed in [place] 07/16/2018 12:45 PM Jerry Avery  MRN:  [id number] Subjective: Patient states " I am doing okay", currently denies symptoms of withdrawal, reports his mood is improved and currently minimizes/denies sadness or neuro vegetative symptoms of depression.  He is focused on discharging soon.  Explains that his family is dependent on him and that he is the provider for his children so cannot afford to be off work for long. Denies suicidal ideations. Objective: I have discussed case with treatment team and have met with patient. 47 year old male, Spanish-speaking, presented to the hospital due to depression, suicidal ideations.  History of alcohol dependence.  Reports he used to drink 12-24 beers per day, has cut down to 6-8 beers per day.  Depression was triggered by argument with wife.  States that she kicked him out.  Acknowledges that argument revolved around his persistent drinking. Using nonconfrontational/motivational interviewing techniques we reviewed alcohol dependence and the negative impact that has had on his relationships.  He states "I am tired of drinking anyway I think I am going to stop".  We reviewed medications such as Campral to help address alcohol use disorder but currently not interested.  Currently patient is not presenting with significant withdrawal symptoms-no significant distal tremors, no psychomotor agitation, no restlessness, no diaphoresis, no visual disturbances, vitals improved. Blood pressure has remained high-he does state that he has been diagnosed with hypertension in the past and had briefly taken an antihypertensive medication about a year ago but took it only briefly and does not remember the name. No disruptive or agitated behaviors on unit, pleasant on approach. Denies side effects. Denies suicidal ideations. Labs reviewed - BMP WNL, K+ normalized, TSH WNL.  Principal Problem: MDD (major depressive disorder), single  episode, severe , no psychosis ([place]) Diagnosis:   Patient Active Problem List   Diagnosis Date Noted  . HTN (hypertension) [I10] 07/16/2018  . MDD (major depressive disorder), single episode, severe , no psychosis ([place]) [F32.2] 07/14/2018   Total Time spent with patient: 20 minutes  Past Psychiatric History:   Past Medical History:  Past Medical History:  Diagnosis Date  . Hypertension    History reviewed. No pertinent surgical history. Family History: History reviewed. No pertinent family history. Family Psychiatric  History:  Social History:  Social History   Substance and Sexual Activity  Alcohol Use Yes  . Alcohol/week: 10.0 standard drinks  . Types: 10 Cans of beer per week     Social History   Substance and Sexual Activity  Drug Use Yes  . Types: Cocaine   Comment: Episodic use    Social History   Socioeconomic History  . Marital status: Married    Spouse name: Not on file  . Number of children: Not on file  . Years of education: Not on file  . Highest education level: Not on file  Occupational History  . Not on file  Social Needs  . Financial resource strain: Not on file  . Food insecurity:    Worry: Not on file    Inability: Not on file  . Transportation needs:    Medical: Not on file    Non-medical: Not on file  Tobacco Use  . Smoking status: Current Every Day Smoker    Packs/day: 0.50    Types: Cigarettes  . Smokeless tobacco: Never Used  Substance and Sexual Activity  . Alcohol use: Yes    Alcohol/week: 10.0 standard drinks    Types: 10 Cans of beer  per week  . Drug use: Yes    Types: Cocaine    Comment: Episodic use  . Sexual activity: Yes  Lifestyle  . Physical activity:    Days per week: Not on file    Minutes per session: Not on file  . Stress: Not on file  Relationships  . Social connections:    Talks on phone: Not on file    Gets together: Not on file    Attends religious service: Not on file    Active member of club or  organization: Not on file    Attends meetings of clubs or organizations: Not on file    Relationship status: Not on file  Other Topics Concern  . Not on file  Social History Narrative   Pt lives in a hotel   Additional Social History:   Sleep: Improved  Appetite:  Good  Current Medications: Current Facility-Administered Medications  Medication Dose Route Frequency Provider Last Rate Last Dose  . chlordiazePOXIDE (LIBRIUM) capsule 25 mg  25 mg Oral Q6H PRN Rankin, Shuvon B, NP   25 mg at 07/15/18 2125  . hydrOXYzine (ATARAX/VISTARIL) tablet 25 mg  25 mg Oral Q6H PRN Rankin, Shuvon B, NP   25 mg at 07/15/18 2009  . lisinopril (PRINIVIL,ZESTRIL) tablet 10 mg  10 mg Oral Daily Emokpae, Courage, MD   10 mg at 07/16/18 1100  . loperamide (IMODIUM) capsule 2-4 mg  2-4 mg Oral PRN Rankin, Shuvon B, NP      . multivitamin with minerals tablet 1 tablet  1 tablet Oral Daily Rankin, Shuvon B, NP   1 tablet at 07/16/18 0820  . ondansetron (ZOFRAN-ODT) disintegrating tablet 4 mg  4 mg Oral Q6H PRN Rankin, Shuvon B, NP      . potassium chloride (K-DUR,KLOR-CON) CR tablet 10 mEq  10 mEq Oral BID Tinisha Etzkorn, Myer Peer, MD   10 mEq at 07/16/18 0820  . thiamine (VITAMIN B-1) tablet 100 mg  100 mg Oral Daily Rankin, Shuvon B, NP   100 mg at 07/16/18 0820  . traZODone (DESYREL) tablet 50 mg  50 mg Oral QHS PRN Chenee Munns, Myer Peer, MD   50 mg at 07/15/18 2124    Lab Results:  Results for orders placed or performed during the hospital encounter of 07/14/18 (from the past 48 hour(s))  Basic metabolic panel     Status: None   Collection Time: 07/16/18  6:26 AM  Result Value Ref Range   Sodium 140 135 - 145 mmol/L   Potassium 3.9 3.5 - 5.1 mmol/L   Chloride 103 98 - 111 mmol/L   CO2 26 22 - 32 mmol/L   Glucose, Bld 93 70 - 99 mg/dL   BUN 20 6 - 20 mg/dL   Creatinine, Ser 0.81 0.61 - 1.24 mg/dL   Calcium 9.5 8.9 - 10.3 mg/dL   GFR calc non Af Amer >60 >60 mL/min   GFR calc Af Amer >60 >60 mL/min     Comment: (NOTE) The eGFR has been calculated using the CKD EPI equation. This calculation has not been validated in all clinical situations. eGFR's persistently <60 mL/min signify possible Chronic Kidney Disease.    Anion gap 11 5 - 15    Comment: Performed at [facility], [place] [address], [place], [place] [address]  TSH     Status: None   Collection Time: 07/16/18  6:26 AM  Result Value Ref Range   TSH 2.911 0.350 - 4.500 uIU/mL  Comment: Performed by a 3rd Generation assay with a functional sensitivity of <=0.01 uIU/mL. Performed at [facility], [place] [address] [place], [place] [address]     Blood Alcohol level:  Lab Results  Component Value Date   ETH 212 (H) 60/45/4098    Metabolic Disorder Labs: No results found for: HGBA1C, MPG No results found for: PROLACTIN No results found for: CHOL, TRIG, HDL, CHOLHDL, VLDL, LDLCALC  Physical Findings: AIMS: Facial and Oral Movements Muscles of Facial Expression: None, normal Lips and Perioral Area: None, normal Jaw: None, normal Tongue: None, normal,Extremity Movements Upper (arms, wrists, hands, fingers): None, normal Lower (legs, knees, ankles, toes): None, normal, Trunk Movements Neck, shoulders, hips: None, normal, Overall Severity Severity of abnormal movements (highest score from questions above): None, normal Incapacitation due to abnormal movements: None, normal Patient's awareness of abnormal movements (rate only patient's report): No Awareness, Dental Status Current problems with teeth and/or dentures?: No Does patient usually wear dentures?: No  CIWA:  CIWA-Ar Total: 1 COWS:  COWS Total Score: 1  Musculoskeletal: Strength & Muscle Tone: within normal limits-no significant distal tremors, no diaphoresis, no psychomotor agitation Gait & Station: normal Patient leans: N/A  Psychiatric Specialty Exam: Physical Exam  ROS denies headache, no visual disturbances, no chest  pain, no shortness of breath, no vomiting  Blood pressure (!) 134/96, pulse 70, temperature 97.9 F (36.6 C), temperature source Oral, resp. rate 16, height _0  (1.626 m), weight 55.8 kg, SpO2 100 %.Body mass index is 21.11 kg/m.  General Appearance: Improving grooming  Eye Contact:  Good  Speech:  Normal Rate  Volume:  Normal  Mood:  Improved and currently denies feeling depressed, presents euthymic  Affect:  Appropriate and more reactive  Thought Process:  Linear and Descriptions of Associations: Intact  Orientation:  Full (Time, Place, and Person)  Thought Content:  No hallucinations, no delusions expressed  Suicidal Thoughts:  No denies suicidal or self-injurious ideations, denies homicidal or violent ideations, specifically also denies any homicidal or violent thoughts towards his wife or any family members, contracts for safety on unit  Homicidal Thoughts:  No  Memory:  Recent and remote grossly intact  Judgement:  Other:  Improving  Insight:  Fair/improving  Psychomotor Activity:  Normal  Concentration:  Concentration: Good and Attention Span: Good  Recall:  Good  Fund of Knowledge:  Good  Language:  Good  Akathisia:  Negative  Handed:  Right  AIMS (if indicated):     Assets:  Desire for Improvement Resilience  ADL's:  Intact  Cognition:  WNL  Sleep:  Number of Hours: 6.75   Assessment-47 year old male, Spanish-speaking, presented for depression, suicidal ideations which she states were triggered by marital conflict.  History of alcohol dependence. He currently denies depression and presents with a full range of affect, denies neurovegetative symptoms, denies suicidal ideations.  Not currently presenting with significant alcohol withdrawal symptoms and presents with fair but improving insight regarding the negative impact that alcohol has had on his life and relationships-acknowledges marital tension is mostly due to his drinking.  Denies suicidal ideations and is future  oriented, focused on being discharged soon in order to return to work.  Treatment Plan Summary: Daily contact with patient to assess and evaluate symptoms and progress in treatment, Medication management, Plan Inpatient treatment and Medications as below Encourage group and milieu participation to work on coping skills and symptom reduction Encourage efforts to work on sobriety and relapse prevention Continue Librium PRN's for alcohol  withdrawal symptoms as needed Have discussed potential antidepressant management with patient.  He is not interested and as above currently denies depression and presents with a much improved mood. Have reviewed case with hospitalist consultant regarding antihypertensive management-started on lisinopril. Continue Trazodone 50 mg nightly PRN for insomnia as needed Continue Vistaril 25 mg every 6 hours PRN for anxiety as needed Treatment team working on disposition planning. Jenne Campus, MD 07/16/2018, 12:45 PM

## 2018-07-16 NOTE — Progress Notes (Signed)
Adult Psychoeducational Group Note  Date:  07/16/2018 Time:  9:23 PM  Group Topic/Focus:  Wrap-Up Group:   The focus of this group is to help patients review their daily goal of treatment and discuss progress on daily workbooks.  Participation Level:  Active  Participation Quality:  Appropriate  Affect:  Appropriate  Cognitive:  Appropriate  Insight: Appropriate  Engagement in Group:  Engaged  Modes of Intervention:  Discussion  Additional Comments: The patient expressed that he rates today a 10 and is looking forward to discharge. Octavio Manns 07/16/2018, 9:23 PM

## 2018-07-16 NOTE — Plan of Care (Signed)
Nurse discussed anxiety, depression, coping skills with patient. 

## 2018-07-16 NOTE — Progress Notes (Signed)
D:  Patient's self inventory sheet, patient sleeps good, sleep med helpful.  Good appetite, normal energy level, good concentration.  Denied depression, hopeless and anxiety.  Denied withdrawals.  Denied SI.  Denied physical problem.  Denied physical pain.  Goal is to recuperate and discharge.  Plans to attend groups and meetings.  Does plan to return home after discharge. A:  Medications administered per MD orders.  Emotional support and encouragement given patient. R:  Patient denied SI and HI, contracts for safety.  Denied A/V hallucinations.  Safety maintained with 15 minute checks. Low salt diet.

## 2018-07-16 NOTE — Tx Team (Signed)
Interdisciplinary Treatment and Diagnostic Plan Update  07/16/2018 Time of Session: 9:40am Jerry Avery MRN: [id number]  Principal Diagnosis: MDD (major depressive disorder), single episode, severe , no psychosis (HCC)  Secondary Diagnoses: Principal Problem:   MDD (major depressive disorder), single episode, severe , no psychosis (HCC) Active Problems:   HTN (hypertension)   Current Medications:  Current Facility-Administered Medications  Medication Dose Route Frequency Provider Last Rate Last Dose  . chlordiazePOXIDE (LIBRIUM) capsule 25 mg  25 mg Oral Q6H PRN Rankin, Shuvon B, NP   25 mg at 07/15/18 2125  . hydrOXYzine (ATARAX/VISTARIL) tablet 25 mg  25 mg Oral Q6H PRN Rankin, Shuvon B, NP   25 mg at 07/15/18 2009  . lisinopril (PRINIVIL,ZESTRIL) tablet 10 mg  10 mg Oral Daily Emokpae, Courage, MD   10 mg at 07/16/18 1100  . loperamide (IMODIUM) capsule 2-4 mg  2-4 mg Oral PRN Rankin, Shuvon B, NP      . multivitamin with minerals tablet 1 tablet  1 tablet Oral Daily Rankin, Shuvon B, NP   1 tablet at 07/16/18 0820  . ondansetron (ZOFRAN-ODT) disintegrating tablet 4 mg  4 mg Oral Q6H PRN Rankin, Shuvon B, NP      . potassium chloride (K-DUR,KLOR-CON) CR tablet 10 mEq  10 mEq Oral BID Cobos, Rockey Situ, MD   10 mEq at 07/16/18 0820  . thiamine (VITAMIN B-1) tablet 100 mg  100 mg Oral Daily Rankin, Shuvon B, NP   100 mg at 07/16/18 0820  . traZODone (DESYREL) tablet 50 mg  50 mg Oral QHS PRN Cobos, Rockey Situ, MD   50 mg at 07/15/18 2124   PTA Medications: No medications prior to admission.    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   Physician Treatment Plan for Primary Diagnosis: MDD (major depressive disorder), single episode, severe , no psychosis (HCC) Long Term Goal(s): Improvement in symptoms so as ready for discharge Improvement in symptoms so as ready for discharge   Short Term Goals: Ability to identify changes in lifestyle to reduce recurrence of condition will improve Ability to verbalize feelings will improve Ability to disclose and discuss suicidal ideas Ability to demonstrate self-control will improve Ability to identify and develop effective coping behaviors will improve Ability to maintain clinical measurements within normal limits will improve Ability to identify triggers associated with substance abuse/mental health issues will improve  Medication Management: Evaluate patient's response, side effects, and tolerance of medication regimen.  Therapeutic Interventions: 1 to 1 sessions, Unit Group sessions and Medication administration.  Evaluation of Outcomes: Progressing  Physician Treatment Plan for Secondary Diagnosis: Principal Problem:   MDD (major depressive disorder), single episode, severe , no psychosis (HCC) Active Problems:   HTN (hypertension)  Long Term Goal(s): Improvement in symptoms so as ready for discharge Improvement in symptoms so as ready for discharge   Short Term Goals: Ability to identify changes in lifestyle to reduce recurrence of condition will improve Ability to verbalize feelings will improve Ability to disclose and discuss suicidal ideas Ability to demonstrate self-control will improve Ability to identify and develop effective coping behaviors will improve Ability to maintain clinical measurements within normal limits will improve Ability to identify triggers associated with substance abuse/mental health issues will improve  Medication Management: Evaluate patient's response, side effects, and tolerance of medication regimen.  Therapeutic  Interventions: 1 to 1 sessions, Unit Group sessions and Medication administration.  Evaluation of Outcomes: Progressing   RN Treatment Plan for Primary Diagnosis: MDD (major depressive disorder), single episode, severe , no psychosis (HCC) Long Term Goal(s): Knowledge of disease and therapeutic regimen to maintain health will improve  Short Term Goals: Ability to participate in decision making will improve, Ability to identify and develop effective coping behaviors will improve and Compliance with prescribed medications will improve  Medication Management: RN will administer medications as ordered by provider, will assess and evaluate patient's response and provide education to patient for prescribed medication. RN will report any adverse and/or side effects to prescribing provider.  Therapeutic Interventions: 1 on 1 counseling sessions, Psychoeducation, Medication administration, Evaluate responses to treatment, Monitor vital signs and CBGs as ordered, Perform/monitor CIWA, COWS, AIMS and Fall Risk screenings as ordered, Perform wound care treatments as ordered.  Evaluation of Outcomes: Progressing   LCSW Treatment Plan for Primary Diagnosis: MDD (major depressive disorder), single episode, severe , no psychosis (HCC) Long Term Goal(s): Safe transition to appropriate next level of care at discharge, Engage patient in therapeutic group addressing interpersonal concerns.  Short Term Goals: Engage patient in aftercare planning with referrals and resources  Therapeutic Interventions: Assess for all discharge needs, 1 to 1 time with Social worker, Explore available resources and support systems, Assess for adequacy in community support network, Educate family and significant other(s) on suicide prevention, Complete Psychosocial Assessment, Interpersonal group therapy.  Evaluation of Outcomes: Progressing   Progress in Treatment: Attending groups: No. Participating in groups: No. Taking  medication as prescribed: Yes. Toleration medication: Yes. Family/Significant other contact made: No, will contact:  if patient consents Patient understands diagnosis: Yes. Discussing patient identified problems/goals with staff: Yes. Medical problems stabilized or resolved: Yes. Denies suicidal/homicidal ideation: Yes. Issues/concerns per patient self-inventory: No. Other:   New problem(s) identified: None   New Short Term/Long Term Goal(s): medication stabilization, elimination of SI thoughts, development of comprehensive mental wellness plan.   Patient Goals: Help with my cocaine and alcohol use   Discharge Plan or Barriers:  CSW will assess for an appropriate discharge plan.   Reason for Continuation of Hospitalization: Depression Medication stabilization  Estimated Length of Stay:3-5 days   Attendees: Patient: 07/16/2018 11:36 AM  Physician: Dr. Nehemiah Massed, MD 07/16/2018 11:36 AM  Nursing: Quintella Reichert, RN 07/16/2018 11:36 AM  RN Care Manager: Juliann Pares 07/16/2018 11:36 AM  Social Worker: Baldo Daub, LCSWA 07/16/2018 11:36 AM  Recreational Therapist: Juliann Pares 07/16/2018 11:36 AM  Other: X 07/16/2018 11:36 AM  Other: X 07/16/2018 11:36 AM  Other:X 07/16/2018 11:36 AM    Scribe for Treatment Team: Maeola Sarah, LCSWA 07/16/2018 11:36 AM

## 2018-07-16 NOTE — Progress Notes (Signed)
   Phone consult with Dr Jama Flavors Patient with history of hypertension admitted to [facility] for alcohol abuse and depressive symptoms  1)HTN--stage II treat empirically with lisinopril 10 mg daily, low-salt diet advised   Hospitalist service will sign off at this time please call back as needed  Shon Hale, MD

## 2018-07-16 NOTE — Progress Notes (Signed)
Writer spoke with interpreter who translated for Clinical research associate. He reports having had a good day and had not been long waking up. He requested drops for his eyes which were very red. He reports that he is discharging on tomorrow because he has to get back to work. He plans to not drink anymore and reports that he and his wife are going to work on their marriage. He has been observed up in the dayroom watching tv and attended group with his interpreter. He requested medication to aid with sleep and eye drops at 2100. Safety maintained on unit with 15 min checks.

## 2018-07-16 NOTE — Progress Notes (Signed)
Adult Psychoeducational Group Note  Date:  07/16/2018 Time:  9:46 AM  Group Topic/Focus:  Orientation:   The focus of this group is to educate the patient on the purpose and policies of crisis stabilization and provide a format to answer questions about their admission.  The group details unit policies and expectations of patients while admitted.  Participation Level:  Active  Participation Quality:  Appropriate  Affect:  Appropriate  Cognitive:  Appropriate  Insight: Appropriate  Engagement in Group:  Engaged  Modes of Intervention:  Discussion and Education  Additional Comments:      Karren Cobble 07/16/2018, 9:46 AM

## 2018-07-17 DIAGNOSIS — F1024 Alcohol dependence with alcohol-induced mood disorder: Secondary | ICD-10-CM

## 2018-07-17 DIAGNOSIS — R45851 Suicidal ideations: Secondary | ICD-10-CM

## 2018-07-17 MED ORDER — HYDROXYZINE HCL 25 MG PO TABS: 25.0000 mg | ORAL_TABLET | Freq: Four times a day (QID) | ORAL | 0 refills | Status: AC | PRN

## 2018-07-17 MED ORDER — TRAZODONE HCL 50 MG PO TABS: 50.0000 mg | ORAL_TABLET | Freq: Every evening | ORAL | 0 refills | Status: AC | PRN

## 2018-07-17 MED ORDER — LISINOPRIL 10 MG PO TABS: 10.0000 mg | ORAL_TABLET | Freq: Every day | ORAL | 0 refills | Status: AC

## 2018-07-17 NOTE — Discharge Summary (Addendum)
Physician Discharge Summary Note  Patient:  Jerry Avery is an 47 y.o., male MRN:  [id number] DOB:  07-24-1971 Patient phone:  [phone] (home)  Patient address:   [address] [place] [address],  Total Time spent with patient: 20 minutes  Date of Admission:  07/14/2018 Date of Discharge: 07/17/18  Reason for Admission:  ETOH abuse with depression and SI   Principal Problem: MDD (major depressive disorder), single episode, severe , no psychosis [facility]) Discharge Diagnoses: Patient Active Problem List   Diagnosis Date Noted  . Alcohol dependence with alcohol-induced mood disorder (HCC) [F10.24]   . Suicidal ideations [R45.851]   . HTN (hypertension) [I10] 07/16/2018  . MDD (major depressive disorder), single episode, severe , no psychosis (HCC) [F32.2] 07/14/2018    Past Psychiatric History: denies prior psychiatric admissions, reports history of depression, which he states is usually short term and related to  relationship issues ,  denies history of suicide attempts, denies history of psychosis, denies history of mania, denies history of violence.  Past Medical History:  Past Medical History:  Diagnosis Date  . Hypertension    History reviewed. No pertinent surgical history. Family History: History reviewed. No pertinent family history. Family Psychiatric  History: no mental illness in family, no history of suicides , no history of alcohol use disorder in family  Social History:  Social History   Substance and Sexual Activity  Alcohol Use Yes  . Alcohol/week: 10.0 standard drinks  . Types: 10 Cans of beer per week     Social History   Substance and Sexual Activity  Drug Use Yes  . Types: Cocaine   Comment: Episodic use    Social History   Socioeconomic History  . Marital status: Married    Spouse name: Not on file  . Number of children: Not on file  . Years of education: Not on file  . Highest education level: Not on file  Occupational History  . Not on  file  Social Needs  . Financial resource strain: Not on file  . Food insecurity:    Worry: Not on file    Inability: Not on file  . Transportation needs:    Medical: Not on file    Non-medical: Not on file  Tobacco Use  . Smoking status: Current Every Day Smoker    Packs/day: 0.50    Types: Cigarettes  . Smokeless tobacco: Never Used  Substance and Sexual Activity  . Alcohol use: Yes    Alcohol/week: 10.0 standard drinks    Types: 10 Cans of beer per week  . Drug use: Yes    Types: Cocaine    Comment: Episodic use  . Sexual activity: Yes  Lifestyle  . Physical activity:    Days per week: Not on file    Minutes per session: Not on file  . Stress: Not on file  Relationships  . Social connections:    Talks on phone: Not on file    Gets together: Not on file    Attends religious service: Not on file    Active member of club or organization: Not on file    Attends meetings of clubs or organizations: Not on file    Relationship status: Not on file  Other Topics Concern  . Not on file  Social History Narrative   Pt lives in a hotel    Hospital Course:   07/15/18 [facility] MD Assessment:47 year old male, lives with SO/common law wife . Employed. Spanish speaker only- interviewed  in [place]. Presented to ED voluntarily due to depression, suicidal ideations with thoughts of cutting his throat. States these suicidal ideations were time limited and in the context of an argument with his SO, who told him she did not want him to return home.  Acknowledges the argument was about her being upset about his drinking .  Patient states that before this incident he was " feeling all right" and not depressed, without any suicidal ideations or significant neuro-vegetative symptoms of depression.  Of note, patient reports history of alcohol dependence- reports that he has history of heavy , daily drinking, up to 24 beers per day, but states he has cut down significantly and now drinks about 6 beers a  day on most days of week. Admission BAL 212, and UDS positive for Cocaine ( denies history of cocaine abuse, states use was isolated )  Patient remained on the [facility] unit for 2 days. The patient stabilized on medication and therapy. Patient was discharged on Trazodone 50 mg QHS PRN, Vistaril 25 mg TID PRN, and Liisinopril 10 mg Daily. Patient has shown improvement with improved mood, affect, sleep, appetite, and interaction. Patient has attended group and participated. Patient has been seen in the day room interacting with peers and staff appropriately. Patient denies any SI/HI/AVH and contracts for safety. Patient agrees to follow up at [facility]. Patient is provided with prescriptions for their medications upon discharge.    Physical Findings: AIMS: Facial and Oral Movements Muscles of Facial Expression: None, normal Lips and Perioral Area: None, normal Jaw: None, normal Tongue: None, normal,Extremity Movements Upper (arms, wrists, hands, fingers): None, normal Lower (legs, knees, ankles, toes): None, normal, Trunk Movements Neck, shoulders, hips: None, normal, Overall Severity Severity of abnormal movements (highest score from questions above): None, normal Incapacitation due to abnormal movements: None, normal Patient's awareness of abnormal movements (rate only patient's report): No Awareness, Dental Status Current problems with teeth and/or dentures?: No Does patient usually wear dentures?: No  CIWA:  CIWA-Ar Total: 0 COWS:  COWS Total Score: 0  Musculoskeletal: Strength & Muscle Tone: within normal limits Gait & Station: normal Patient leans: N/A  Psychiatric Specialty Exam: Physical Exam  Nursing note and vitals reviewed. Constitutional: He is oriented to person, place, and time. He appears well-developed and well-nourished.  Cardiovascular: Normal rate.  Respiratory: Effort normal.  Musculoskeletal: Normal range of motion.  Neurological: He is alert and  oriented to person, place, and time.  Skin: Skin is warm.    Review of Systems  Constitutional: Negative.   HENT: Negative.   Eyes: Negative.   Respiratory: Negative.   Cardiovascular: Negative.   Gastrointestinal: Negative.   Genitourinary: Negative.   Musculoskeletal: Negative.   Skin: Negative.   Neurological: Negative.   Endo/Heme/Allergies: Negative.   Psychiatric/Behavioral: Negative.     Blood pressure 111/76, pulse 71, temperature 97.8 F (36.6 C), temperature source Oral, resp. rate 18, height 5\' 4"  (1.626 m), weight 55.8 kg, SpO2 100 %.Body mass index is 21.11 kg/m.  General Appearance: Casual  Eye Contact:  Good  Speech:  Clear and Coherent and Normal Rate  Volume:  Normal  Mood:  Euthymic  Affect:  Appropriate  Thought Process:  Goal Directed and Descriptions of Associations: Intact  Orientation:  Full (Time, Place, and Person)  Thought Content:  WDL  Suicidal Thoughts:  No  Homicidal Thoughts:  No  Memory:  Immediate;   Good Recent;   Good Remote;   Good  Judgement:  Good  Insight:  Good  Psychomotor Activity:  Normal  Concentration:  Concentration: Good and Attention Span: Good  Recall:  Good  Fund of Knowledge:  Good  Language:  Good  Akathisia:  No  Handed:  Right  AIMS (if indicated):     Assets:  Communication Skills Desire for Improvement Financial Resources/Insurance Housing Physical Health Social Support Transportation  ADL's:  Intact  Cognition:  WNL  Sleep:  Number of Hours: 6.75     Have you used any form of tobacco in the last 30 days? (Cigarettes, Smokeless Tobacco, Cigars, and/or Pipes): Yes("I smoke 1 pkt / day")  Has this patient used any form of tobacco in the last 30 days? (Cigarettes, Smokeless Tobacco, Cigars, and/or Pipes) Yes, Yes, A prescription for an FDA-approved tobacco cessation medication was offered at discharge and the patient refused  Blood Alcohol level:  Lab Results  Component Value Date   ETH 212 (H)  07/14/2018    Metabolic Disorder Labs:  No results found for: HGBA1C, MPG No results found for: PROLACTIN No results found for: CHOL, TRIG, HDL, CHOLHDL, VLDL, LDLCALC  See Psychiatric Specialty Exam and Suicide Risk Assessment completed by Attending Physician prior to discharge.  Discharge destination:  Home  Is patient on multiple antipsychotic therapies at discharge:  No   Has Patient had three or more failed trials of antipsychotic monotherapy by history:  No  Recommended Plan for Multiple Antipsychotic Therapies: NA   Allergies as of 07/17/2018   No Known Allergies     Medication List    TAKE these medications     Indication  hydrOXYzine 25 MG tablet Commonly known as:  ATARAX/VISTARIL Take 1 tablet (25 mg total) by mouth every 6 (six) hours as needed for anxiety.  Indication:  Feeling Anxious   lisinopril 10 MG tablet Commonly known as:  PRINIVIL,ZESTRIL Take 1 tablet (10 mg total) by mouth daily. For high blood pressure  Indication:  High Blood Pressure Disorder   traZODone 50 MG tablet Commonly known as:  DESYREL Take 1 tablet (50 mg total) by mouth at bedtime as needed for sleep.  Indication:  Trouble Sleeping      Follow-up Information    [facility] Follow up.   Why:  Please make an appointment to see your primary care physician within 7 days.   Contact information: [address] [place] [address] [phone]          Follow-up recommendations:  Continue activity as tolerated. Continue diet as recommended by your PCP. Ensure to keep all appointments with outpatient providers.  Comments:  Patient is instructed prior to discharge to: Take all medications as prescribed by his/her mental healthcare provider. Report any adverse effects and or reactions from the medicines to his/her outpatient provider promptly. Patient has been instructed & cautioned: To not engage in alcohol and or illegal drug  use while on prescription medicines. In the event of worsening symptoms, patient is instructed to call the crisis hotline, 911 and or go to the nearest ED for appropriate evaluation and treatment of symptoms. To follow-up with his/her primary care provider for your other medical issues, concerns and or health care needs.    Signed: Gerlene Burdock Money, FNP 07/17/2018, 10:22 AM   Patient seen, Suicide Assessment Completed.  Disposition Plan Reviewed

## 2018-07-17 NOTE — [facility] Group Notes (Signed)
[facility] Group Notes:  (Nursing)  Date:  07/17/2018  Time: 1:15 PM Type of Therapy:  Nurse Education  Participation Level:  Active  Participation Quality:  Attentive  Affect:  Appropriate  Cognitive:  Appropriate  Insight:  Appropriate  Engagement in Group:  Engaged  Modes of Intervention:  Discussion and Education  Summary of Progress/Problems: Nurse led Life Skills group: Identifying Needs Shela Nevin 07/17/2018, 2:55 PM

## 2018-07-17 NOTE — [facility] Suicide Risk Assessment (Signed)
[facility] Discharge Suicide Risk Assessment   Principal Problem: MDD (major depressive disorder), single episode, severe , no psychosis (HCC) Discharge Diagnoses:  Patient Active Problem List   Diagnosis Date Noted  . HTN (hypertension) [I10] 07/16/2018  . MDD (major depressive disorder), single episode, severe , no psychosis (HCC) [F32.2] 07/14/2018    Total Time spent with patient: 30 minutes  Musculoskeletal: Strength & Muscle Tone: within normal limits Gait & Station: normal Patient leans: N/A  Psychiatric Specialty Exam: ROS no headache, no chest pain, no shortness of breath, no vomiting   Blood pressure 126/86, pulse 76, temperature 98.6 F (37 C), temperature source Oral, resp. rate 18, height 5\' 4"  (1.626 m), weight 55.8 kg, SpO2 100 %.Body mass index is 21.11 kg/m.  General Appearance: Well Groomed  Eye Contact::  Good  Speech:  Normal Rate409  Volume:  Normal  Mood:  reports improved mood , presents euthymic  Affect:  Appropriate and Full Range  Thought Process:  Linear and Descriptions of Associations: Intact  Orientation:  Full (Time, Place, and Person)  Thought Content:  no hallucinations, no delusions  Suicidal Thoughts:  No denies suicidal or self injurious ideations, denies homicidal or violent ideations  Homicidal Thoughts:  No  Memory:  recent and remote grossly intact   Judgement:  Other:  improving   Insight:  fair- improving   Psychomotor Activity:  Normal- no current tremors, no diaphoresis, calm/no psychomotor restlessness, no symptoms of alcohol WDL endorsed or noted at this time  Concentration:  Good  Recall:  Good  Fund of Knowledge:Good  Language: Good  Akathisia:  Negative  Handed:  Right  AIMS (if indicated):     Assets:  Desire for Improvement Resilience  Sleep:  Number of Hours: 6.75  Cognition: WNL  ADL's:  Intact   Mental Status Per Nursing Assessment::   On Admission:  Suicidal ideation indicated by patient(Prior to  admission)  Demographic Factors:  47 year old male, single, lives with SO, has two children, employed- Spanish Speaking only   Loss Factors: Relationship issues, states GF asked him to leave due to her being upset about his drinking  Language barrier  Historical Factors: history of alcohol dependence, no prior psychiatric admissions, no history of suicide attempts   Risk Reduction Factors:   Sense of responsibility to family, Employed and Positive coping skills or problem solving skills  Continued Clinical Symptoms:  At this time patient alert, attentive, well related, calm, no current symptoms of alcohol WDL, no thought disorder, mood improved, currently euthymic, affect appropriate. No hallucinations, no delusions, not internally preoccupied, no SI or HI, future oriented , focused on returning to work. Denies medication side effects Calm/pleasant on approach.  Cognitive Features That Contribute To Risk:  No gross cognitive deficits noted upon discharge. Is alert , attentive, and oriented x 3   Suicide Risk:  Mild:  Suicidal ideation of limited frequency, intensity, duration, and specificity.  There are no identifiable plans, no associated intent, mild dysphoria and related symptoms, good self-control (both objective and subjective assessment), few other risk factors, and identifiable protective factors, including available and accessible social support.    Plan Of Care/Follow-up recommendations:  Activity:  as tolerated Diet:  heart healthy Tests:  NA Other:  See below  Patient expressing readiness for discharge, leaving unit in good spirits. Expresses desire to stop drinking . Denies cravings. Regular AA attendance encouraged. On no standing psychiatric medications at discharge. Referral to [facility] for ongoing medical management.  Madaline Guthrie  A Mylie Mccurley, MD 07/17/2018, 8:00 AM

## 2018-07-17 NOTE — Progress Notes (Signed)
D: Pt A & O X 3. Denies SI, HI, AVH and pain at this time. D/C home as ordered. Picked up by [employer] taxi cab. Plan to be taken to [facility]. Patient reports sleeping well last night, and receiving trazodone that was helpful. His appetite is good, energy high, and concentration good. He rates depression, sense of hopelessness, and anxiety 0/10. He denies suicidal thoughts. No physical complaints or withdrawal symptoms at this time.  A: D/C instructions reviewed with pt including prescriptions, medication samples and follow up appointment, compliance encouraged. All belongings from locker # given to pt at time of departure. Scheduled and PRN medications given with verbal education and effects monitored. Safety checks maintained without incident till time of d/c.  R: Pt receptive to care. Compliant with medications when offered. Denies adverse drug reactions when assessed. Verbalized understanding related to d/c instructions. Signed belonging sheet in agreement with items received from locker. Ambulatory with a steady gait. Appears to be in no physical distress at time of departure.

## 2018-07-17 NOTE — [facility] Suicide Risk Assessment (Signed)
[facility] INPATIENT:  Family/Significant Other Suicide Prevention Education  Suicide Prevention Education:  Contact Attempts: [employer], employer, [phone] been identified by the patient as the family member/significant other with whom the patient will be residing, and identified as the person(s) who will aid the patient in the event of a mental health crisis.  With written consent from the patient, two attempts were made to provide suicide prevention education, prior to and/or following the patient's discharge.  We were unsuccessful in providing suicide prevention education.  A suicide education pamphlet was given to the patient to share with family/significant other.  Date and time of first attempt: 07/17/18  /  8:47 AM  Date and time of second attempt:  Patient shares that his employer is flying by plane to [facility] this morning and cannot be contacted.  Suicide Prevention Education was reviewed thoroughly with patient, including risk factors, warning signs, and what to do.  Mobile Crisis services were described and that telephone number pointed out, with encouragement to patient to put this number in personal cell phone.  Spanish-speaking brochure was provided to patient to share with natural supports.  Patient acknowledged the ways in which they are at risk, and how working through each of their issues can gradually start to reduce their risk factors.  Patient was encouraged to think of the information in the context of people in their own lives.  Patient denied having access to firearms  Patient verbalized understanding of information provided.  Patient endorsed a desire to live.      Carloyn Jaeger Grossman-Orr 07/17/2018, 8:47 AM

## 2018-07-17 NOTE — [facility] Group Notes (Signed)
LCSW Group Therapy Note  07/17/2018   10:00-11:00am   Type of Therapy and Topic:  Group Therapy: Anger Cues and Responses  Participation Level:  Did Not Attend   Description of Group:   In this group, patients learned how to recognize the physical, cognitive, emotional, and behavioral responses they have to anger-provoking situations.  They identified a recent time they became angry and how they reacted.  They analyzed how their reaction was possibly beneficial and how it was possibly unhelpful.  The group discussed a variety of healthier coping skills that could help with such a situation in the future.  Deep breathing was practiced briefly.  Therapeutic Goals: 1. Patients will remember their last incident of anger and how they felt emotionally and physically, what their thoughts were at the time, and how they behaved. 2. Patients will identify how their behavior at that time worked for them, as well as how it worked against them. 3. Patients will explore possible new behaviors to use in future anger situations. 4. Patients will learn that anger itself is normal and cannot be eliminated, and that healthier reactions can assist with resolving conflict rather than worsening situations.  Summary of Patient Progress:  Did not attend Therapeutic Modalities:   Cognitive Behavioral Therapy  Omari Mcmanaway D Dorette Hartel    

## 2018-07-17 NOTE — Progress Notes (Signed)
  [facility] Adult Case Management Discharge Plan :  Will you be returning to the same living situation after discharge:  No.  Going to a hotel At discharge, do you have transportation home?: Yes,  arranged by patient Do you have the ability to pay for your medications: No.  Not on medications at d/c  Release of information consent forms completed and turned in to Medical Records by CSW.   Patient to Follow up at: Follow-up Information    [facility] Follow up.   Why:  Please make an appointment to see your primary care physician within 7 days.   Contact information: [address] [place] [address] [phone]          Next level of care provider has access to [facility] Link:yes  Safety Planning and Suicide Prevention discussed: No.  Spanish brochure given  Have you used any form of tobacco in the last 30 days? (Cigarettes, Smokeless Tobacco, Cigars, and/or Pipes): Yes("I smoke 1 pkt / day")  Has patient been referred to the Quitline?: Patient refused referral  Patient has been referred for addiction treatment: Pt. refused referral  Lynnell Chad, LCSW 07/17/2018, 8:44 AM

## 2018-12-29 IMAGING — CT CT ABD-PELV W/ CM
2 of 5 series · 16 of 46 positions shown, 18 images · IV contrast (APPLIED)
Comparison: None.

CLINICAL DATA: Rectal pain x5 days

EXAM:
CT ABDOMEN AND PELVIS WITH CONTRAST
TECHNIQUE: Multidetector CT imaging of the abdomen and pelvis was performed
using the standard protocol following bolus administration of
intravenous contrast.
CONTRAST:  100mL EIZQ5H-CQQ IOPAMIDOL (EIZQ5H-CQQ) INJECTION 61%

[Series 3: abd/ pelvis 5.0 i30f 2 · axial · 0.66mm/px · z∈[+958,+1358]mm · 13 of 92 slices shown, 15 images]
[im 6/92  soft-tissue]
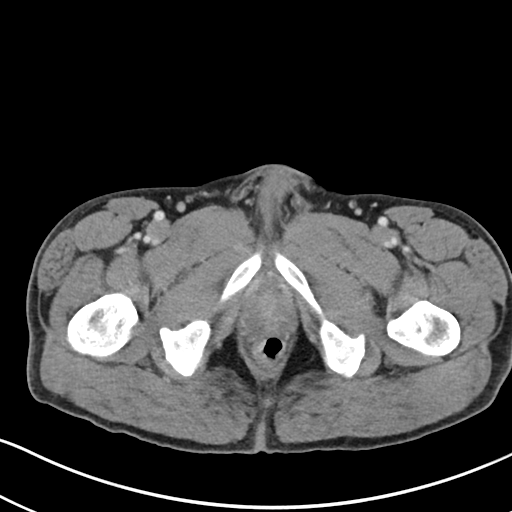
[im 6/92  bone]
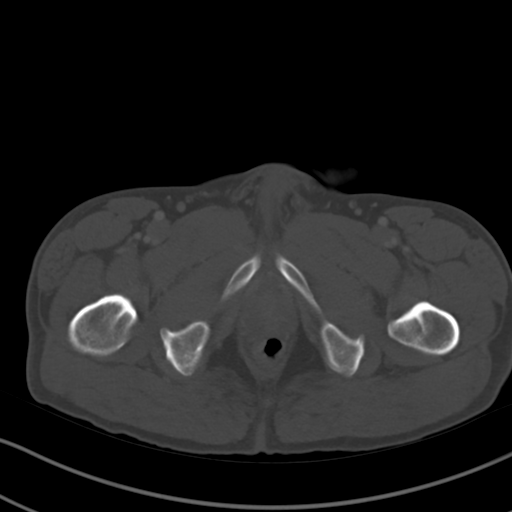
[im 12/92  soft-tissue]
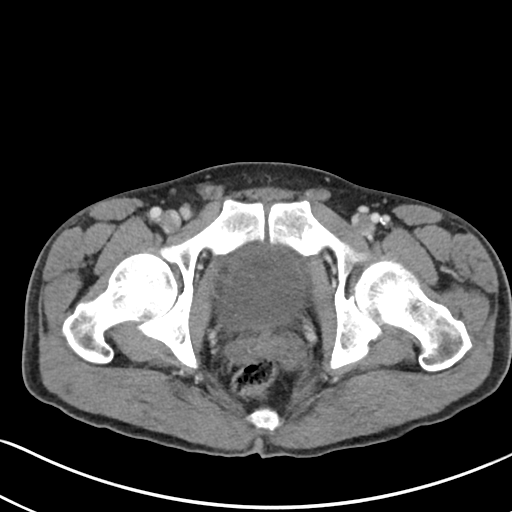
[im 18/92  soft-tissue]
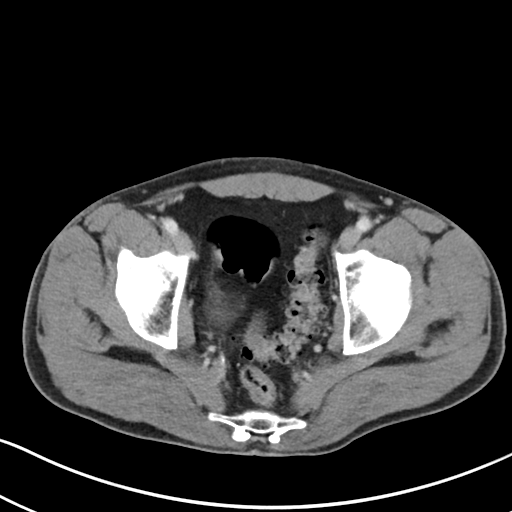
[im 29/92  soft-tissue]
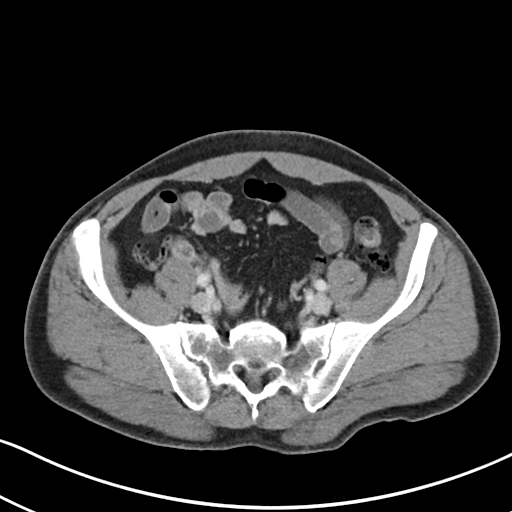
[im 35/92  soft-tissue]
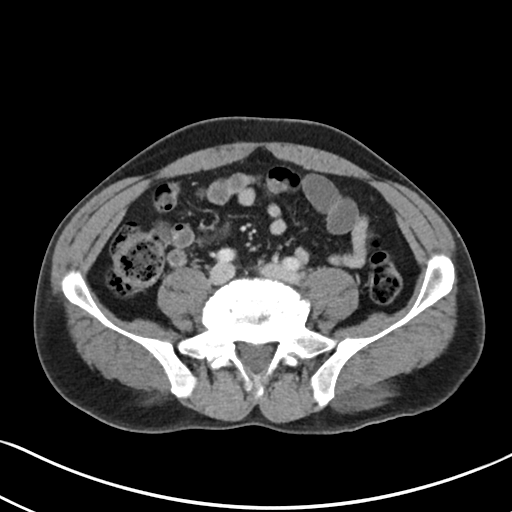
[im 40/92  soft-tissue]
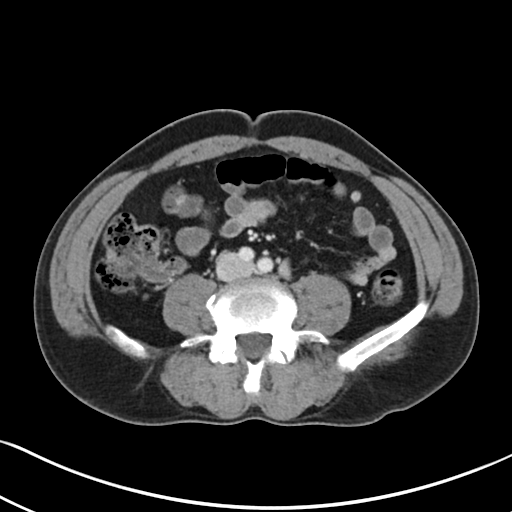
[im 46/92  soft-tissue]
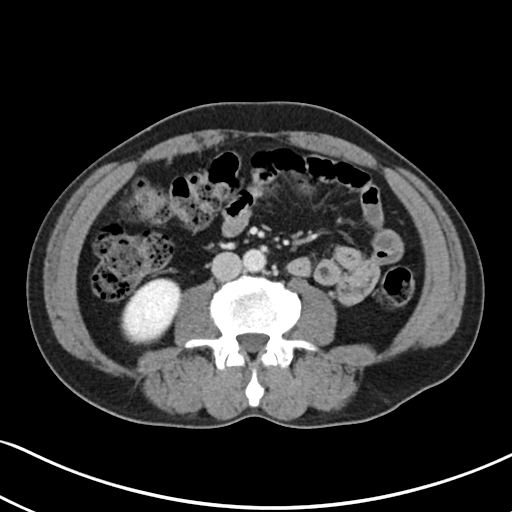
[im 52/92  soft-tissue]
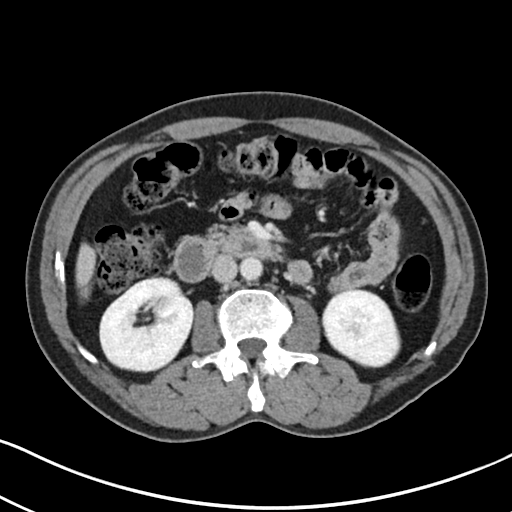
[im 57/92  soft-tissue]
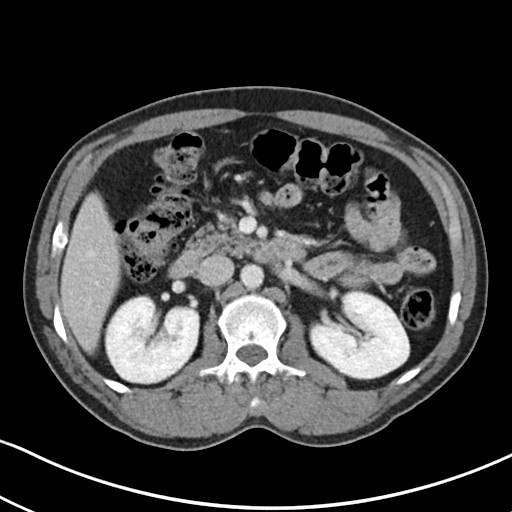
[im 57/92  bone]
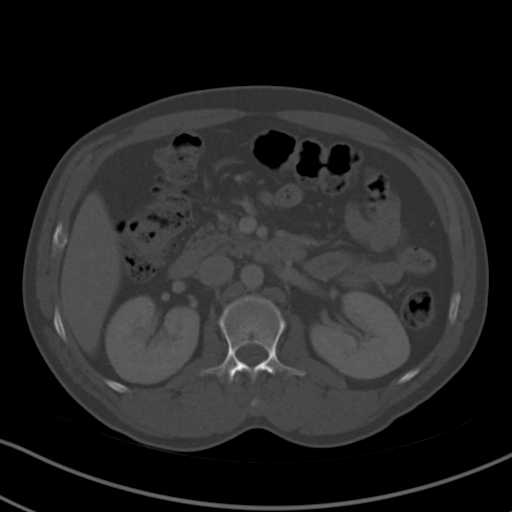
[im 63/92  soft-tissue]
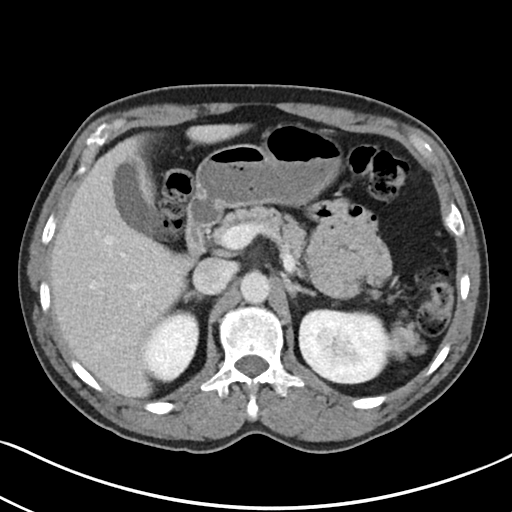
[im 74/92  soft-tissue]
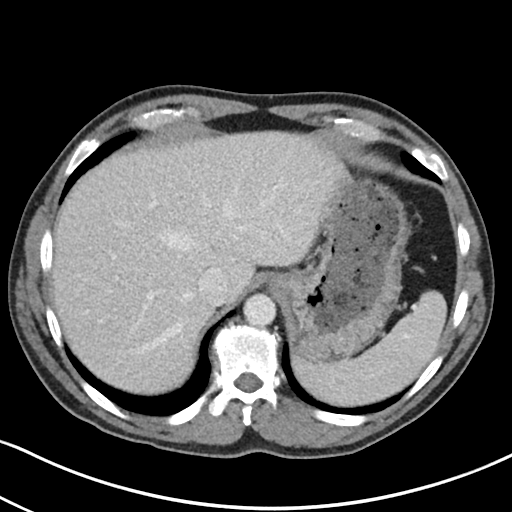
[im 80/92  soft-tissue]
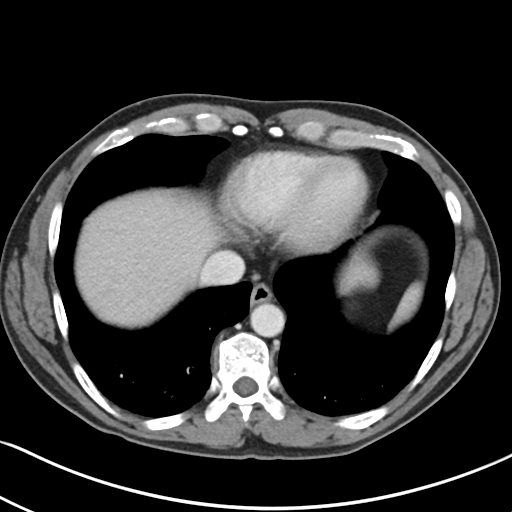
[im 86/92  soft-tissue]
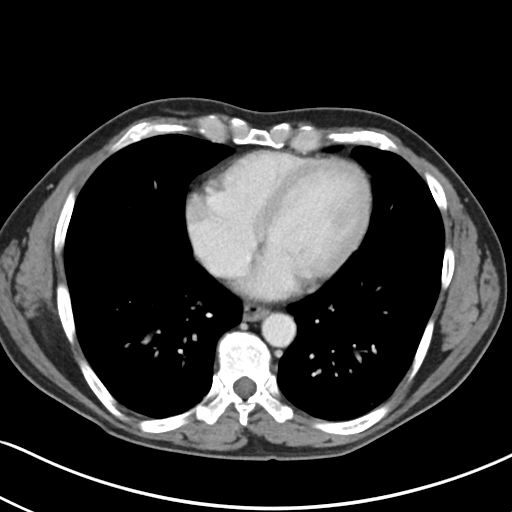

[Series 6: coronal soft tissue · coronal · 0.63mm/px · 3 of 80 slices shown]
[im 27/80  soft-tissue]
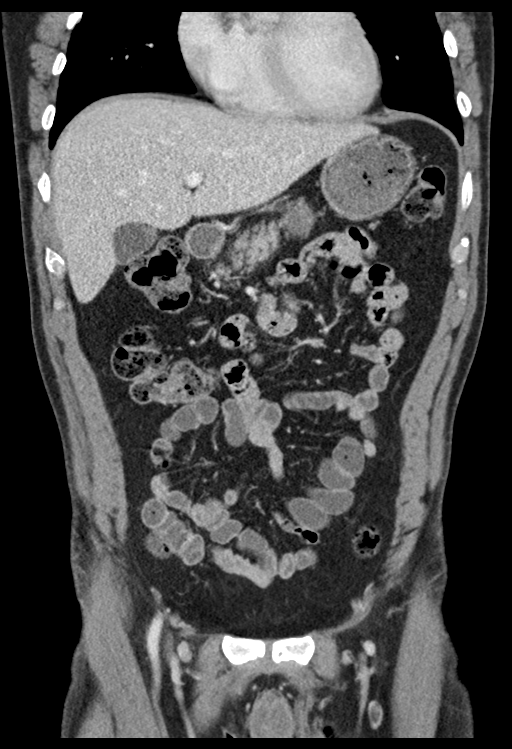
[im 36/80  soft-tissue]
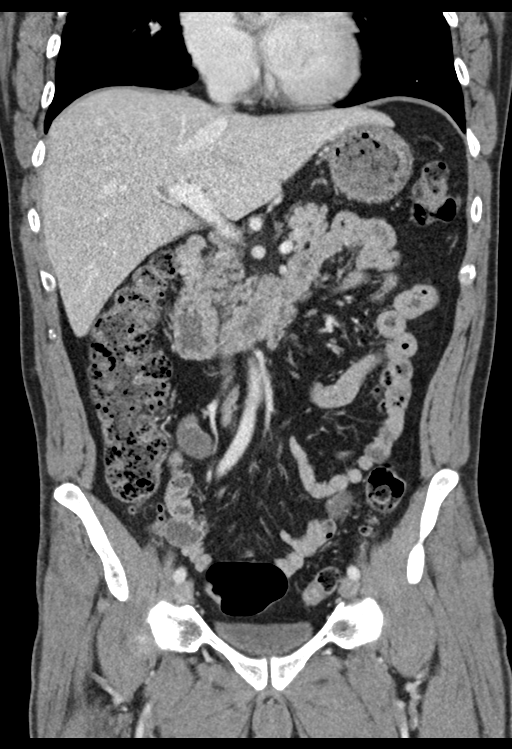
[im 44/80  soft-tissue]
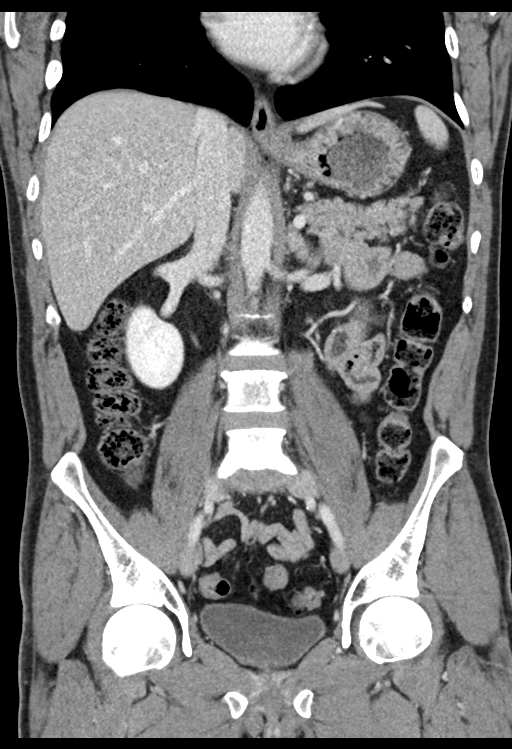

[16 of 46 positions shown; findings below may reference images not displayed]

FINDINGS: Lower chest: Normal size cardiac chambers. Dependent atelectasis at
each lung base.

Hepatobiliary: No focal liver abnormality is seen. No gallstones,
gallbladder wall thickening, or biliary dilatation. Pain is
hypodensity along the falciform ligament may represent some fat
deposition or partial volume averaging of the falciform.

Pancreas: Normal

Spleen: Normal

Adrenals/Urinary Tract: Normal

Stomach/Bowel: The stomach is physiologically distended. There is
normal small bowel rotation without bowel obstruction or
inflammation. There is stool throughout large intestine with
descending and sigmoid diverticulosis and circular muscle
hypertrophy. No acute inflammation is noted. No perirectal
inflammation or abscess.

Vascular/Lymphatic: Aortic atherosclerosis. No enlarged abdominal or
pelvic lymph nodes.

Reproductive: Prostate is unremarkable.

Other: No abdominal wall hernia or abnormality. No abdominopelvic
ascites.

Musculoskeletal: Mild disc space narrowing L5-S1. No acute nor
suspicious osseous abnormality.
IMPRESSION: Descending and sigmoid colonic diverticulosis without acute
diverticulitis.
